# Patient Record
Sex: Male | Born: 1960 | Race: Black or African American | Hispanic: No | Marital: Married | Smoking: Never smoker
Health system: Southern US, Community
[De-identification: ages and names within clinical notes are randomized; demographics above are authoritative.]

## PROBLEM LIST (undated history)

## (undated) DIAGNOSIS — E785 Hyperlipidemia, unspecified: Secondary | ICD-10-CM

## (undated) DIAGNOSIS — N400 Enlarged prostate without lower urinary tract symptoms: Secondary | ICD-10-CM

## (undated) DIAGNOSIS — K219 Gastro-esophageal reflux disease without esophagitis: Secondary | ICD-10-CM

## (undated) DIAGNOSIS — I2510 Atherosclerotic heart disease of native coronary artery without angina pectoris: Secondary | ICD-10-CM

## (undated) DIAGNOSIS — I10 Essential (primary) hypertension: Secondary | ICD-10-CM

## (undated) HISTORY — PX: WISDOM TOOTH EXTRACTION: SHX21

## (undated) HISTORY — PX: COLONOSCOPY: SHX174

## (undated) HISTORY — DX: Benign prostatic hyperplasia without lower urinary tract symptoms: N40.0

## (undated) HISTORY — PX: HERNIA REPAIR: SHX51

---

## 2003-09-05 ENCOUNTER — Ambulatory Visit (HOSPITAL_BASED_OUTPATIENT_CLINIC_OR_DEPARTMENT_OTHER): Admission: RE | Admit: 2003-09-05 | Discharge: 2003-09-05 | Payer: Self-pay | Admitting: Surgery

## 2003-09-05 ENCOUNTER — Ambulatory Visit (HOSPITAL_COMMUNITY): Admission: RE | Admit: 2003-09-05 | Discharge: 2003-09-05 | Payer: Self-pay | Admitting: Surgery

## 2012-07-01 ENCOUNTER — Ambulatory Visit: Payer: Self-pay | Admitting: Family Medicine

## 2013-03-27 ENCOUNTER — Emergency Department: Payer: Self-pay | Admitting: Emergency Medicine

## 2013-03-27 LAB — CBC WITH DIFFERENTIAL/PLATELET
Basophil #: 0 10*3/uL (ref 0.0–0.1)
Basophil %: 0.5 %
Eosinophil #: 0.1 10*3/uL (ref 0.0–0.7)
Eosinophil %: 0.9 %
HCT: 47 % (ref 40.0–52.0)
HGB: 16.1 g/dL (ref 13.0–18.0)
Lymphocyte #: 1 10*3/uL (ref 1.0–3.6)
Lymphocyte %: 14 %
MCH: 28.2 pg (ref 26.0–34.0)
MCHC: 34.3 g/dL (ref 32.0–36.0)
MCV: 82 fL (ref 80–100)
Monocyte #: 0.4 x10 3/mm (ref 0.2–1.0)
Monocyte %: 4.7 %
Neutrophil #: 5.9 10*3/uL (ref 1.4–6.5)
Neutrophil %: 79.9 %
Platelet: 175 10*3/uL (ref 150–440)
RBC: 5.71 10*6/uL (ref 4.40–5.90)
RDW: 13.2 % (ref 11.5–14.5)
WBC: 7.4 10*3/uL (ref 3.8–10.6)

## 2013-03-27 LAB — COMPREHENSIVE METABOLIC PANEL
Albumin: 3.8 g/dL (ref 3.4–5.0)
Alkaline Phosphatase: 74 U/L
Anion Gap: 4 — ABNORMAL LOW (ref 7–16)
BUN: 13 mg/dL (ref 7–18)
Bilirubin,Total: 0.5 mg/dL (ref 0.2–1.0)
Calcium, Total: 9.1 mg/dL (ref 8.5–10.1)
Chloride: 105 mmol/L (ref 98–107)
Co2: 29 mmol/L (ref 21–32)
Creatinine: 1.24 mg/dL (ref 0.60–1.30)
EGFR (African American): >60
EGFR (Non-African Amer.): >60
Glucose: 125 mg/dL — ABNORMAL HIGH (ref 65–99)
Osmolality: 277 (ref 275–301)
Potassium: 3.9 mmol/L (ref 3.5–5.1)
SGOT(AST): 34 U/L (ref 15–37)
SGPT (ALT): 38 U/L (ref 12–78)
Sodium: 138 mmol/L (ref 136–145)
Total Protein: 7.7 g/dL (ref 6.4–8.2)

## 2013-03-27 LAB — URINALYSIS, COMPLETE
Bacteria: NONE SEEN
Bilirubin,UR: NEGATIVE
Blood: NEGATIVE
Glucose,UR: NEGATIVE mg/dL (ref 0–75)
Ketone: NEGATIVE
Leukocyte Esterase: NEGATIVE
Nitrite: NEGATIVE
Ph: 6 (ref 4.5–8.0)
Protein: NEGATIVE
RBC,UR: 1 /HPF (ref 0–5)
Specific Gravity: 1.02 (ref 1.003–1.030)
Squamous Epithelial: NONE SEEN
WBC UR: NONE SEEN /HPF (ref 0–5)

## 2013-03-27 LAB — LIPASE, BLOOD: Lipase: 510 U/L — ABNORMAL HIGH (ref 73–393)

## 2016-03-20 DIAGNOSIS — M7582 Other shoulder lesions, left shoulder: Secondary | ICD-10-CM | POA: Diagnosis not present

## 2016-05-04 DIAGNOSIS — E782 Mixed hyperlipidemia: Secondary | ICD-10-CM | POA: Diagnosis not present

## 2016-05-04 DIAGNOSIS — G44209 Tension-type headache, unspecified, not intractable: Secondary | ICD-10-CM | POA: Diagnosis not present

## 2016-05-04 DIAGNOSIS — Z23 Encounter for immunization: Secondary | ICD-10-CM | POA: Diagnosis not present

## 2016-05-04 DIAGNOSIS — N400 Enlarged prostate without lower urinary tract symptoms: Secondary | ICD-10-CM | POA: Diagnosis not present

## 2016-05-04 DIAGNOSIS — Z0000 Encounter for general adult medical examination without abnormal findings: Secondary | ICD-10-CM | POA: Diagnosis not present

## 2016-05-04 DIAGNOSIS — I10 Essential (primary) hypertension: Secondary | ICD-10-CM | POA: Diagnosis not present

## 2016-05-04 DIAGNOSIS — Z1159 Encounter for screening for other viral diseases: Secondary | ICD-10-CM | POA: Diagnosis not present

## 2016-11-03 DIAGNOSIS — N400 Enlarged prostate without lower urinary tract symptoms: Secondary | ICD-10-CM | POA: Diagnosis not present

## 2016-11-03 DIAGNOSIS — I10 Essential (primary) hypertension: Secondary | ICD-10-CM | POA: Diagnosis not present

## 2016-11-03 DIAGNOSIS — G44209 Tension-type headache, unspecified, not intractable: Secondary | ICD-10-CM | POA: Diagnosis not present

## 2016-11-03 DIAGNOSIS — E782 Mixed hyperlipidemia: Secondary | ICD-10-CM | POA: Diagnosis not present

## 2017-09-13 DIAGNOSIS — E782 Mixed hyperlipidemia: Secondary | ICD-10-CM | POA: Diagnosis not present

## 2017-09-13 DIAGNOSIS — N400 Enlarged prostate without lower urinary tract symptoms: Secondary | ICD-10-CM | POA: Diagnosis not present

## 2017-09-13 DIAGNOSIS — I10 Essential (primary) hypertension: Secondary | ICD-10-CM | POA: Diagnosis not present

## 2017-09-13 DIAGNOSIS — G44209 Tension-type headache, unspecified, not intractable: Secondary | ICD-10-CM | POA: Diagnosis not present

## 2017-09-13 DIAGNOSIS — Z0000 Encounter for general adult medical examination without abnormal findings: Secondary | ICD-10-CM | POA: Diagnosis not present

## 2017-12-08 DIAGNOSIS — R1030 Lower abdominal pain, unspecified: Secondary | ICD-10-CM | POA: Diagnosis not present

## 2017-12-08 DIAGNOSIS — R5383 Other fatigue: Secondary | ICD-10-CM | POA: Diagnosis not present

## 2018-01-06 DIAGNOSIS — R102 Pelvic and perineal pain: Secondary | ICD-10-CM | POA: Diagnosis not present

## 2018-09-20 DIAGNOSIS — E782 Mixed hyperlipidemia: Secondary | ICD-10-CM | POA: Diagnosis not present

## 2018-09-20 DIAGNOSIS — I10 Essential (primary) hypertension: Secondary | ICD-10-CM | POA: Diagnosis not present

## 2018-09-20 DIAGNOSIS — N400 Enlarged prostate without lower urinary tract symptoms: Secondary | ICD-10-CM | POA: Diagnosis not present

## 2018-09-20 DIAGNOSIS — Z0000 Encounter for general adult medical examination without abnormal findings: Secondary | ICD-10-CM | POA: Diagnosis not present

## 2018-09-20 DIAGNOSIS — G44209 Tension-type headache, unspecified, not intractable: Secondary | ICD-10-CM | POA: Diagnosis not present

## 2018-10-04 DIAGNOSIS — E782 Mixed hyperlipidemia: Secondary | ICD-10-CM | POA: Diagnosis not present

## 2018-10-04 DIAGNOSIS — Z125 Encounter for screening for malignant neoplasm of prostate: Secondary | ICD-10-CM | POA: Diagnosis not present

## 2018-10-04 DIAGNOSIS — I10 Essential (primary) hypertension: Secondary | ICD-10-CM | POA: Diagnosis not present

## 2019-04-13 ENCOUNTER — Other Ambulatory Visit: Payer: Self-pay

## 2019-04-13 ENCOUNTER — Encounter: Payer: Self-pay | Admitting: Emergency Medicine

## 2019-04-13 ENCOUNTER — Ambulatory Visit
Admission: EM | Admit: 2019-04-13 | Discharge: 2019-04-13 | Disposition: A | Discharge status: Home / Self Care | Payer: Federal, State, Local not specified - PPO | Attending: Urgent Care | Admitting: Urgent Care

## 2019-04-13 DIAGNOSIS — R519 Headache, unspecified: Secondary | ICD-10-CM | POA: Insufficient documentation

## 2019-04-13 DIAGNOSIS — E785 Hyperlipidemia, unspecified: Secondary | ICD-10-CM | POA: Diagnosis not present

## 2019-04-13 DIAGNOSIS — Z79899 Other long term (current) drug therapy: Secondary | ICD-10-CM | POA: Diagnosis not present

## 2019-04-13 DIAGNOSIS — R5383 Other fatigue: Secondary | ICD-10-CM | POA: Diagnosis not present

## 2019-04-13 DIAGNOSIS — I10 Essential (primary) hypertension: Secondary | ICD-10-CM | POA: Diagnosis not present

## 2019-04-13 DIAGNOSIS — Z20822 Contact with and (suspected) exposure to covid-19: Secondary | ICD-10-CM | POA: Insufficient documentation

## 2019-04-13 HISTORY — DX: Essential (primary) hypertension: I10

## 2019-04-13 HISTORY — DX: Hyperlipidemia, unspecified: E78.5

## 2019-04-13 NOTE — ED Triage Notes (Signed)
Patient had a +covid exposure on 04/10/19. Patient starting having headache and fatigue x 2 days. Patient has tried OTC Nyquil.

## 2019-04-13 NOTE — Discharge Instructions (Addendum)
You may take Tylenol 1000 mg with Ibuprofen 600 mg every 8h for headache. To help your immune system take the following while having symptoms.  Vit C 1000 mg a day Vit D 5000 IU a day Zinc 50 mg a day   If you develop SOB, go to ER.

## 2019-04-13 NOTE — ED Provider Notes (Signed)
[facility]    CSN: [id number] Arrival date & time: 04/13/19  [place]      History   Chief Complaint Chief Complaint  Patient presents with  . +covid exposure  . Headache  . Fatigue    HPI Brian Curry is a 59 y.o. male. who presents with HA and fatigue only x 2 days. Denies URI, fever, chills or other symptoms. Has not taken anything for his HA. Was exposed to someone with covid on 1/11.     Past Medical History:  Diagnosis Date  . Hyperlipidemia   . Hypertension     There are no problems to display for this patient.   Past Surgical History:  Procedure Laterality Date  . HERNIA REPAIR         Home Medications    Prior to Admission medications   Medication Sig Start Date End Date Taking? Authorizing Provider  ezetimibe-simvastatin (VYTORIN) 10-40 MG tablet Take 1 tablet by mouth daily. 03/23/19  Yes [provider]  losartan-hydrochlorothiazide (HYZAAR) 100-12.5 MG tablet Take 1 tablet by mouth daily. 03/23/19  Yes [provider]  naproxen (NAPROSYN) 500 MG tablet Take 500 mg by mouth 2 (two) times daily. prn 03/27/19  Yes [provider]  tamsulosin (FLOMAX) 0.4 MG CAPS capsule Take 0.4 mg by mouth daily. 03/23/19  Yes [provider]    Family History Family History  Problem Relation Age of Onset  . Heart attack Mother   . Hypertension Mother   . Hypertension Father   . Heart attack Father   . Stroke Father     Social History Social History   Tobacco Use  . Smoking status: Never Smoker  . Smokeless tobacco: Never Used  Substance Use Topics  . Alcohol use: Yes    Comment: occasional  . Drug use: Never     Allergies   Patient has no known allergies.   Review of Systems Review of Systems Negative for cough, ear  Pain, rhinitis, ST, diarrhea, chest pains, SOB, loss of taste or smell, abdominal pain.   Physical Exam Triage Vital Signs ED Triage Vitals  Enc Vitals Group     BP  04/13/19 1900 138/87     Pulse Rate 04/13/19 1900 71     Resp 04/13/19 1900 18     Temp 04/13/19 1900 98.1 F (36.7 C)     Temp Source 04/13/19 1900 Oral     SpO2 04/13/19 1900 99 %     Weight 04/13/19 1901 190 lb (86.2 kg)     Height 04/13/19 1901 5\' 7"  (1.702 m)     Head Circumference --      Peak Flow --      Pain Score 04/13/19 1901 0     Pain Loc --      Pain Edu? --      Excl. in [place]? --    No data found.  Updated Vital Signs BP 138/87 (BP Location: Left Arm)   Pulse 71   Temp 98.1 F (36.7 C) (Oral)   Resp 18   Ht 5\' 7"  (1.702 m)   Wt 190 lb (86.2 kg)   SpO2 99%   BMI 29.76 kg/m   Visual Acuity Right Eye Distance:   Left Eye Distance:   Bilateral Distance:    Right Eye Near:   Left Eye Near:    Bilateral Near:     Physical Exam Physical Exam Vitals signs and nursing note reviewed.  Constitutional:  General: he is not in acute distress.    Appearance: Normal appearance. he is not ill-appearing, toxic-appearing or diaphoretic.  HENT: PERRLA    Head: Normocephalic.     Right Ear: Tympanic membrane, ear canal and external ear normal.     Left Ear: Tympanic membrane, ear canal and external ear normal.     Nose: Nose normal.     Mouth/Throat:     Mouth: Mucous membranes are moist.  Eyes:     General: No scleral icterus.       Right eye: No discharge.        Left eye: No discharge.     Conjunctiva/sclera: Conjunctivae normal.  Neck:     Musculoskeletal: Neck supple. No neck rigidity.  Cardiovascular:     Rate and Rhythm: Normal rate and regular rhythm.     Heart sounds: No murmur.  Pulmonary:     Effort: Pulmonary effort is normal.     Breath sounds: Normal breath sounds.   Musculoskeletal: Normal range of motion.  Lymphadenopathy:     Cervical: No cervical adenopathy.  Skin:    General: Skin is warm and dry.     Coloration: Skin is not jaundiced.     Findings: No rash.  Neurological:     Mental Status: he is alert and oriented to person,  place, and time.     Gait: Gait normal.  Psychiatric:        Mood and Affect: Mood normal.        Behavior: Behavior normal.        Thought Content: Thought content normal.        Judgment: Judgment normal.    UC Treatments / Results  Labs (all labs ordered are listed, but only abnormal results are displayed) Labs Reviewed  NOVEL CORONAVIRUS, NAA (HOSP ORDER, SEND-OUT TO REF LAB; TAT 18-24 HRS)    EKG   Radiology No results found.  Procedures Procedures (including critical care time)  Medications Ordered in UC Medications - No data to display  Initial Impression / Assessment and Plan / UC Course  I have reviewed the triage vital signs and the nursing notes. Advised to stay quarantined til his covid test is back. See instructions for support.  Final Clinical Impressions(s) / UC Diagnoses   Final diagnoses:  Close exposure to COVID-19 virus  Nonintractable episodic headache, unspecified headache type  Other fatigue     Discharge Instructions     You may take Tylenol 1000 mg with Ibuprofen 600 mg every 8h for headache. To help your immune system take the following while having symptoms.  Vit C 1000 mg a day Vit D 5000 IU a day Zinc 50 mg a day   If you develop SOB, go to ER.     ED Prescriptions    None     PDMP not reviewed this encounter.   Garey Ham, [place] 04/13/19 1940

## 2019-04-15 LAB — NOVEL CORONAVIRUS, NAA (HOSP ORDER, SEND-OUT TO REF LAB; TAT 18-24 HRS): SARS-CoV-2, NAA: NOT DETECTED

## 2019-04-17 DIAGNOSIS — K219 Gastro-esophageal reflux disease without esophagitis: Secondary | ICD-10-CM | POA: Diagnosis not present

## 2019-05-21 ENCOUNTER — Other Ambulatory Visit: Payer: Self-pay

## 2019-05-21 ENCOUNTER — Ambulatory Visit
Admission: EM | Admit: 2019-05-21 | Discharge: 2019-05-21 | Disposition: A | Discharge status: Home / Self Care | Payer: Federal, State, Local not specified - PPO | Attending: Family Medicine | Admitting: Family Medicine

## 2019-05-21 ENCOUNTER — Encounter: Payer: Self-pay | Admitting: Emergency Medicine

## 2019-05-21 DIAGNOSIS — B354 Tinea corporis: Secondary | ICD-10-CM

## 2019-05-21 MED ORDER — FLUCONAZOLE 150 MG PO TABS: 150.0000 mg | ORAL_TABLET | Freq: Once | ORAL | 0 refills | Status: AC

## 2019-05-21 NOTE — ED Provider Notes (Signed)
[facility]    CSN: [id number] Arrival date & time: 05/21/19  1313      History   Chief Complaint Chief Complaint  Patient presents with  . Rash    HPI Brian Curry is a 59 y.o. male.   59 yo male with a c/o rash on his right arm for 2-3 months. States rash is itchy and seems to improve with application of otc lotrimin and cortizone but then rash comes back. Denies any drainage, fevers, chills, pain.       Past Medical History:  Diagnosis Date  . Hyperlipidemia   . Hypertension     There are no problems to display for this patient.   Past Surgical History:  Procedure Laterality Date  . HERNIA REPAIR         Home Medications    Prior to Admission medications   Medication Sig Start Date End Date Taking? Authorizing Provider  ezetimibe-simvastatin (VYTORIN) 10-40 MG tablet Take 1 tablet by mouth daily. 03/23/19  Yes [provider]  losartan-hydrochlorothiazide (HYZAAR) 100-12.5 MG tablet Take 1 tablet by mouth daily. 03/23/19  Yes [provider]  naproxen (NAPROSYN) 500 MG tablet Take 500 mg by mouth 2 (two) times daily. prn 03/27/19  Yes [provider]  tamsulosin (FLOMAX) 0.4 MG CAPS capsule Take 0.4 mg by mouth daily. 03/23/19  Yes [provider]  fluconazole (DIFLUCAN) 150 MG tablet Take 1 tablet (150 mg total) by mouth once for 1 dose. Repeat in 1 week 05/21/19 05/21/19  Norval Gable, MD    Family History Family History  Problem Relation Age of Onset  . Heart attack Mother   . Hypertension Mother   . Hypertension Father   . Heart attack Father   . Stroke Father     Social History Social History   Tobacco Use  . Smoking status: Never Smoker  . Smokeless tobacco: Never Used  Substance Use Topics  . Alcohol use: Yes    Comment: occasional  . Drug use: Never     Allergies   Patient has no known allergies.   Review of Systems Review of Systems   Physical Exam Triage Vital  Signs ED Triage Vitals  Enc Vitals Group     BP 05/21/19 1324 128/75     Pulse Rate 05/21/19 1324 68     Resp 05/21/19 1324 18     Temp 05/21/19 1324 97.6 F (36.4 C)     Temp Source 05/21/19 1324 Oral     SpO2 05/21/19 1324 99 %     Weight 05/21/19 1324 190 lb (86.2 kg)     Height 05/21/19 1324 5\' 7"  (1.702 m)     Head Circumference --      Peak Flow --      Pain Score 05/21/19 1323 0     Pain Loc --      Pain Edu? --      Excl. in [place]? --    No data found.  Updated Vital Signs BP 128/75 (BP Location: Left Arm)   Pulse 68   Temp 97.6 F (36.4 C) (Oral)   Resp 18   Ht 5\' 7"  (1.702 m)   Wt 86.2 kg   SpO2 99%   BMI 29.76 kg/m   Visual Acuity Right Eye Distance:   Left Eye Distance:   Bilateral Distance:    Right Eye Near:   Left Eye Near:    Bilateral Near:     Physical Exam  Vitals and nursing note reviewed.  Constitutional:      General: He is not in acute distress.    Appearance: He is not toxic-appearing or diaphoretic.  Skin:    Findings: Rash (approx 2x2 cm erythematous, scaly, papular, well circumscribed rash on right forearm; no drainage) present.  Neurological:     Mental Status: He is alert.      UC Treatments / Results  Labs (all labs ordered are listed, but only abnormal results are displayed) Labs Reviewed - No data to display  EKG   Radiology No results found.  Procedures Procedures (including critical care time)  Medications Ordered in UC Medications - No data to display  Initial Impression / Assessment and Plan / UC Course  I have reviewed the triage vital signs and the nursing notes.  Pertinent labs & imaging results that were available during my care of the patient were reviewed by me and considered in my medical decision making (see chart for details).      Final Clinical Impressions(s) / UC Diagnoses   Final diagnoses:  Tinea corporis     Discharge Instructions     Continue topical lotrimin and cortizone  creams    ED Prescriptions    Medication Sig Dispense Auth. Provider   fluconazole (DIFLUCAN) 150 MG tablet Take 1 tablet (150 mg total) by mouth once for 1 dose. Repeat in 1 week 2 tablet Payton Mccallum, MD      1. diagnosis reviewed with patient 2. rx as per orders above; reviewed possible side effects, interactions, risks and benefits  3. Recommend supportive treatment as above 4. Follow-up prn if symptoms worsen or don't improve   PDMP not reviewed this encounter.   Payton Mccallum, MD 05/21/19 1357

## 2019-05-21 NOTE — ED Triage Notes (Signed)
Patient in today c/o rash on his right arm x 3 months off & on. Patient has used OTC Lotrimin, blue start ointment, cortisone and witch hazel. Patient states the rash will resolve while he uses these OTC medicines, but returns when he stops using them.

## 2019-05-21 NOTE — Discharge Instructions (Signed)
Continue topical lotrimin and cortizone creams

## 2019-10-03 DIAGNOSIS — Z125 Encounter for screening for malignant neoplasm of prostate: Secondary | ICD-10-CM | POA: Diagnosis not present

## 2019-10-03 DIAGNOSIS — K219 Gastro-esophageal reflux disease without esophagitis: Secondary | ICD-10-CM | POA: Diagnosis not present

## 2019-10-03 DIAGNOSIS — E782 Mixed hyperlipidemia: Secondary | ICD-10-CM | POA: Diagnosis not present

## 2019-10-03 DIAGNOSIS — N400 Enlarged prostate without lower urinary tract symptoms: Secondary | ICD-10-CM | POA: Diagnosis not present

## 2019-10-03 DIAGNOSIS — I10 Essential (primary) hypertension: Secondary | ICD-10-CM | POA: Diagnosis not present

## 2019-10-03 DIAGNOSIS — Z0000 Encounter for general adult medical examination without abnormal findings: Secondary | ICD-10-CM | POA: Diagnosis not present

## 2019-10-03 DIAGNOSIS — Z23 Encounter for immunization: Secondary | ICD-10-CM | POA: Diagnosis not present

## 2019-10-03 DIAGNOSIS — R7309 Other abnormal glucose: Secondary | ICD-10-CM | POA: Diagnosis not present

## 2019-12-06 DIAGNOSIS — N400 Enlarged prostate without lower urinary tract symptoms: Secondary | ICD-10-CM | POA: Diagnosis not present

## 2019-12-06 DIAGNOSIS — R109 Unspecified abdominal pain: Secondary | ICD-10-CM | POA: Diagnosis not present

## 2019-12-06 DIAGNOSIS — K219 Gastro-esophageal reflux disease without esophagitis: Secondary | ICD-10-CM | POA: Diagnosis not present

## 2019-12-06 DIAGNOSIS — M5410 Radiculopathy, site unspecified: Secondary | ICD-10-CM | POA: Diagnosis not present

## 2020-01-03 DIAGNOSIS — Z23 Encounter for immunization: Secondary | ICD-10-CM | POA: Diagnosis not present

## 2020-01-29 DIAGNOSIS — M545 Low back pain, unspecified: Secondary | ICD-10-CM | POA: Diagnosis not present

## 2020-01-29 DIAGNOSIS — M5136 Other intervertebral disc degeneration, lumbar region: Secondary | ICD-10-CM | POA: Diagnosis not present

## 2020-01-29 DIAGNOSIS — M48062 Spinal stenosis, lumbar region with neurogenic claudication: Secondary | ICD-10-CM | POA: Diagnosis not present

## 2020-01-29 DIAGNOSIS — M1612 Unilateral primary osteoarthritis, left hip: Secondary | ICD-10-CM | POA: Diagnosis not present

## 2020-01-29 DIAGNOSIS — M419 Scoliosis, unspecified: Secondary | ICD-10-CM | POA: Diagnosis not present

## 2020-02-26 DIAGNOSIS — M48062 Spinal stenosis, lumbar region with neurogenic claudication: Secondary | ICD-10-CM | POA: Diagnosis not present

## 2020-02-26 DIAGNOSIS — M419 Scoliosis, unspecified: Secondary | ICD-10-CM | POA: Diagnosis not present

## 2020-02-26 DIAGNOSIS — M542 Cervicalgia: Secondary | ICD-10-CM | POA: Diagnosis not present

## 2020-02-26 DIAGNOSIS — M545 Low back pain, unspecified: Secondary | ICD-10-CM | POA: Diagnosis not present

## 2020-06-06 ENCOUNTER — Ambulatory Visit: Payer: Federal, State, Local not specified - PPO | Admitting: Cardiology

## 2020-06-06 ENCOUNTER — Other Ambulatory Visit: Payer: Self-pay

## 2020-06-06 ENCOUNTER — Encounter: Payer: Self-pay | Admitting: Cardiology

## 2020-06-06 VITALS — BP 145/73 | HR 72 | Temp 97.8°F | Resp 17 | Ht 67.0 in | Wt 203.0 lb

## 2020-06-06 DIAGNOSIS — Z6831 Body mass index (BMI) 31.0-31.9, adult: Secondary | ICD-10-CM

## 2020-06-06 DIAGNOSIS — Z8249 Family history of ischemic heart disease and other diseases of the circulatory system: Secondary | ICD-10-CM | POA: Diagnosis not present

## 2020-06-06 DIAGNOSIS — I10 Essential (primary) hypertension: Secondary | ICD-10-CM | POA: Diagnosis not present

## 2020-06-06 DIAGNOSIS — I208 Other forms of angina pectoris: Secondary | ICD-10-CM | POA: Diagnosis not present

## 2020-06-06 DIAGNOSIS — E6609 Other obesity due to excess calories: Secondary | ICD-10-CM

## 2020-06-06 DIAGNOSIS — E782 Mixed hyperlipidemia: Secondary | ICD-10-CM

## 2020-06-06 DIAGNOSIS — R072 Precordial pain: Secondary | ICD-10-CM

## 2020-06-06 MED ORDER — METOPROLOL TARTRATE 25 MG PO TABS: 25.0000 mg | ORAL_TABLET | Freq: Two times a day (BID) | ORAL | 0 refills | Status: DC

## 2020-06-06 NOTE — Progress Notes (Signed)
Date:  06/06/2020   ID:  Brian Curry, DOB 13-Feb-1961, MRN [id number]  PCP:  Maury Dus, MD  Cardiologist:  Rex Kras, DO, [facility] (established care 06/06/2020) Former Cardiology Providers: NA  REASON FOR CONSULT: Family history of heart disease  REQUESTING PHYSICIAN:  Maury Dus, MD [place],  [place] [address]  Chief Complaint  Patient presents with  . New Patient (Initial Visit)    Ref by Maury Dus, MD  . Chest Pain    HPI  Brian Curry is a 60 y.o. African American  male who presents to the office with a chief complaint of " family history of heart disease and chest pain." Patient's past medical history and cardiovascular risk factors include: Hypertension, anxiety, BPH, obesity due to excess calories, mixed hyperlipidemia, bilateral carpal tunnel,  He is referred to the office at the request of Maury Dus, MD for evaluation of family history of heart disease.  Chest pain: Patient states that he has been having indigestion for the last 8 years however, over the last 2 years and the symptoms have gotten worse.  Patient states that since January 2021 he was started on heartburn medications which initially helped with his symptoms however they have started to resurface.  He describes a burning-like sensation in the substernal region, worse with strenuous activity and sleep walking, and improves with resting.  Patient states that the symptoms are lasting for a few minutes.  Associated symptoms also include difficulty in breathing.  Patient was seen by cardiologist approximately 8 years ago and had undergone a nuclear stress test which was reported to be normal according to the patient.  Family hx of CAD mom and dad. Dad had MI around age of 34 and Mom had MI around 35.   FUNCTIONAL STATUS: Walks 1-2x per week for about 3 miles at a time.    ALLERGIES: No Known Allergies  MEDICATION LIST PRIOR TO VISIT: Current Meds   Medication Sig  . ezetimibe-simvastatin (VYTORIN) 10-40 MG tablet Take 1 tablet by mouth daily.  . famotidine (PEPCID) 40 MG tablet Take 40 mg by mouth daily.  Marland Kitchen ibuprofen (ADVIL) 200 MG tablet Take 1 tablet by mouth daily as needed.  Marland Kitchen losartan-hydrochlorothiazide (HYZAAR) 100-12.5 MG tablet Take 1 tablet by mouth daily.  . metoprolol tartrate (LOPRESSOR) 25 MG tablet Take 1 tablet (25 mg total) by mouth 2 (two) times daily.  Marland Kitchen omeprazole (PRILOSEC) 20 MG capsule Take 1 capsule by mouth daily.  . predniSONE (STERAPRED UNI-PAK 21 TAB) 5 MG (21) TBPK tablet Take 1 tablet by mouth daily as needed.  . tamsulosin (FLOMAX) 0.4 MG CAPS capsule Take 0.4 mg by mouth daily.     PAST MEDICAL HISTORY: Past Medical History:  Diagnosis Date  . BPH (benign prostatic hyperplasia)   . Hyperlipidemia   . Hypertension     PAST SURGICAL HISTORY: Past Surgical History:  Procedure Laterality Date  . HERNIA REPAIR      FAMILY HISTORY: The patient family history includes Heart attack in his father and mother; Hypertension in his father and mother; Stroke in his father.  SOCIAL HISTORY:  The patient  reports that he has never smoked. He has never used smokeless tobacco. He reports current alcohol use. He reports that he does not use drugs.  REVIEW OF SYSTEMS: Review of Systems  Constitutional: Negative for chills and fever.  HENT: Negative for hoarse voice and nosebleeds.   Eyes: Negative for discharge, double vision  and pain.  Cardiovascular: Positive for chest pain and dyspnea on exertion. Negative for claudication, leg swelling, near-syncope, orthopnea, palpitations, paroxysmal nocturnal dyspnea and syncope.  Respiratory: Negative for hemoptysis and shortness of breath.   Musculoskeletal: Negative for muscle cramps and myalgias.  Gastrointestinal: Positive for heartburn. Negative for abdominal pain, constipation, diarrhea, hematemesis, hematochezia, melena, nausea and vomiting.  Neurological:  Negative for dizziness and light-headedness.    PHYSICAL EXAM: Vitals with BMI 06/06/2020 05/21/2019 04/13/2019  Height _0  _1  _2   Weight 203 lbs 190 lbs 190 lbs  BMI 31.79 38.10 17.51  Systolic 025 852 778  Diastolic 73 75 87  Pulse 72 68 71    CONSTITUTIONAL: Well-developed and well-nourished. No acute distress.  SKIN: Skin is warm and dry. No rash noted. No cyanosis. No pallor. No jaundice HEAD: Normocephalic and atraumatic.  EYES: No scleral icterus MOUTH/THROAT: Moist oral membranes.  NECK: No JVD present. No thyromegaly noted. No carotid bruits  LYMPHATIC: No visible cervical adenopathy.  CHEST Normal respiratory effort. No intercostal retractions  LUNGS: Clear to auscultation bilaterally.  No stridor. No wheezes. No rales.  CARDIOVASCULAR: Regular rate and rhythm, positive S1-S2, no murmurs rubs or gallops appreciated. ABDOMINAL: No apparent ascites.  EXTREMITIES: No peripheral edema  HEMATOLOGIC: No significant bruising NEUROLOGIC: Oriented to person, place, and time. Nonfocal. Normal muscle tone.  PSYCHIATRIC: Normal mood and affect. Normal behavior. Cooperative  CARDIAC DATABASE: EKG: 06/06/2020: Normal sinus rhythm, 77 bpm, normal axis, without underlying ischemia or injury pattern.  Echocardiogram: No results found for this or any previous visit from the past 1095 days.   Stress Testing: No results found for this or any previous visit from the past 1095 days.  Heart Catheterization: None  LABORATORY DATA: CBC Latest Ref Rng & Units 03/27/2013  WBC 3.8 - 10.6 x10 3/mm 3 7.4  Hemoglobin 13.0 - 18.0 g/dL 16.1  Hematocrit 40.0 - 52.0 % 47.0  Platelets 150 - 440 x10 3/mm 3 175    CMP Latest Ref Rng & Units 03/27/2013  Glucose 65 - 99 mg/dL 125(H)  BUN 7 - 18 mg/dL 13  Creatinine 0.60 - 1.30 mg/dL 1.24  Sodium 136 - 145 mmol/L 138  Potassium 3.5 - 5.1 mmol/L 3.9  Chloride 98 - 107 mmol/L 105  CO2 21 - 32 mmol/L 29  Calcium 8.5 - 10.1 mg/dL 9.1   Total Protein 6.4 - 8.2 g/dL 7.7  Total Bilirubin 0.2 - 1.0 mg/dL 0.5  Alkaline Phos Unit/L 74  AST 15 - 37 Unit/L 34  ALT 12 - 78 U/L 38    Lipid Panel  No results found for: CHOL, TRIG, HDL, CHOLHDL, VLDL, LDLCALC, LDLDIRECT, LABVLDL  No components found for: NTPROBNP No results for input(s): PROBNP in the last 8760 hours. No results for input(s): TSH in the last 8760 hours.  BMP No results for input(s): NA, K, CL, CO2, GLUCOSE, BUN, CREATININE, CALCIUM, GFRNONAA, GFRAA in the last 8760 hours.  HEMOGLOBIN A1C No results found for: HGBA1C, MPG  External Labs: Collected: 10/04/2018 Creatinine 1.23 mg/dL. eGFR: 73 mL/min per 1.73 m Hemoglobin 15.9 g/dL, hematocrit 46.5% AST 20, ALT 23, alkaline phosphatase 64 Lipid profile: Total cholesterol 134, triglycerides 50, HDL 41, LDL 84, non-HDL 94  IMPRESSION:    ICD-10-CM   1. 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 Family history of premature CAD  Z82.49 EKG 12-Lead  3. Benign hypertension  I10   4. Mixed hyperlipidemia  E78.2   5. Class 1 obesity due to excess calories without serious comorbidity with body mass index (BMI) of 31.0 to 31.9 in adult  E66.09    Z68.31   6. Precordial pain  R07.2 CT CORONARY MORPH W/CTA COR W/SCORE W/CA W/CM &/OR WO/CM    CT CORONARY FRACTIONAL FLOW RESERVE DATA PREP    CT CORONARY FRACTIONAL FLOW RESERVE FLUID ANALYSIS     RECOMMENDATIONS: Brian Curry is a 60 y.o. African American male whose past medical history and cardiac risk factors include:  Hypertension, anxiety, BPH, obesity due to excess calories, mixed hyperlipidemia, bilateral carpal tunnel.   Anginal equivalent: EKG shows normal sinus rhythm without underlying  injury pattern. Patient symptoms though concerning for GERD still persist despite being treated with medical therapy.   Given his multiple cardiovascular risk factors concern for obstructive CAD. Check BMP. Echocardiogram will be ordered to evaluate for structural heart disease and left ventricular systolic function. Coronary CTA to evaluate for obstructive CAD. Lopressor 25 mg p.o. twice daily.  Benign essential hypertension: Patient states that his home blood pressures are better controlled. Continue current antihypertensive medications. Currently managed by PCP.  Hyperlipidemia, mixed: Continue statin therapy Most recent lipid profile reviewed.  Family history of premature CAD: Continue lifestyle modifications, work-up as discussed above.  FINAL MEDICATION LIST END OF ENCOUNTER: Meds ordered this encounter  Medications  . metoprolol tartrate (LOPRESSOR) 25 MG tablet    Sig: Take 1 tablet (25 mg total) by mouth 2 (two) times daily.    Dispense:  180 tablet    Refill:  0    Medications Discontinued During This Encounter  Medication Reason  . naproxen (NAPROSYN) 500 MG tablet Error     Current Outpatient Medications:  .  ezetimibe-simvastatin (VYTORIN) 10-40 MG tablet, Take 1 tablet by mouth daily., Disp: , Rfl:  .  famotidine (PEPCID) 40 MG tablet, Take 40 mg by mouth daily., Disp: , Rfl:  .  ibuprofen (ADVIL) 200 MG tablet, Take 1 tablet by mouth daily as needed., Disp: , Rfl:  .  losartan-hydrochlorothiazide (HYZAAR) 100-12.5 MG tablet, Take 1 tablet by mouth daily., Disp: , Rfl:  .  metoprolol tartrate (LOPRESSOR) 25 MG tablet, Take 1 tablet (25 mg total) by mouth 2 (two) times daily., Disp: 180 tablet, Rfl: 0 .  omeprazole (PRILOSEC) 20 MG capsule, Take 1 capsule by mouth daily., Disp: , Rfl:  .  predniSONE (STERAPRED UNI-PAK 21 TAB) 5 MG (21) TBPK tablet, Take 1 tablet by mouth daily as needed., Disp: , Rfl:  .  tamsulosin (FLOMAX) 0.4 MG CAPS capsule, Take 0.4 mg by  mouth daily., Disp: , Rfl:   Orders Placed This Encounter  Procedures  . CT CORONARY MORPH W/CTA COR W/SCORE W/CA W/CM &/OR WO/CM  . CT CORONARY FRACTIONAL FLOW RESERVE DATA PREP  . CT CORONARY FRACTIONAL FLOW RESERVE FLUID ANALYSIS  . Basic metabolic panel  . EKG 12-Lead  . PCV ECHOCARDIOGRAM COMPLETE    There are no Patient Instructions on file for this visit.   --Continue cardiac medications as reconciled in final medication list. --No follow-ups on file. Or sooner if needed. --Continue follow-up with your primary care physician regarding the management of your other chronic comorbid conditions.  Patient's questions and concerns  were addressed to his satisfaction. He voices understanding of the instructions provided during this encounter.   This note was created using a voice recognition software as a result there may be grammatical errors inadvertently enclosed that do not reflect the nature of this encounter. Every attempt is made to correct such errors.  Rex Kras, [place], [facility]  Pager: [phone] Office: [phone]

## 2020-06-11 ENCOUNTER — Other Ambulatory Visit: Payer: Self-pay

## 2020-06-11 ENCOUNTER — Ambulatory Visit: Payer: Federal, State, Local not specified - PPO

## 2020-06-11 DIAGNOSIS — I208 Other forms of angina pectoris: Secondary | ICD-10-CM | POA: Diagnosis not present

## 2020-06-21 DIAGNOSIS — I208 Other forms of angina pectoris: Secondary | ICD-10-CM | POA: Diagnosis not present

## 2020-06-22 LAB — BASIC METABOLIC PANEL
BUN/Creatinine Ratio: 12 (ref 10–24)
BUN: 17 mg/dL (ref 8–27)
CO2: 24 mmol/L (ref 20–29)
Calcium: 9.4 mg/dL (ref 8.6–10.2)
Chloride: 96 mmol/L (ref 96–106)
Creatinine, Ser: 1.44 mg/dL — ABNORMAL HIGH (ref 0.76–1.27)
Glucose: 108 mg/dL — ABNORMAL HIGH (ref 65–99)
Potassium: 4.3 mmol/L (ref 3.5–5.2)
Sodium: 136 mmol/L (ref 134–144)
eGFR: 55 mL/min/{1.73_m2} — ABNORMAL LOW (ref >59)

## 2020-06-24 ENCOUNTER — Telehealth (HOSPITAL_COMMUNITY): Payer: Self-pay | Admitting: *Deleted

## 2020-06-24 NOTE — Progress Notes (Signed)
No answer left a vm

## 2020-06-24 NOTE — Telephone Encounter (Signed)
Attempted to call patient regarding upcoming cardiac CT appointment. °Left message on voicemail with name and callback number ° °Zakaree Mcclenahan RN Navigator Cardiac Imaging °[facility] °[phone] Office °[phone] Cell ° °

## 2020-06-25 ENCOUNTER — Ambulatory Visit (HOSPITAL_COMMUNITY)
Admission: RE | Admit: 2020-06-25 | Discharge: 2020-06-25 | Disposition: A | Discharge status: Home / Self Care | Payer: Federal, State, Local not specified - PPO | Source: Ambulatory Visit | Attending: Family Medicine | Admitting: Family Medicine

## 2020-06-25 ENCOUNTER — Other Ambulatory Visit: Payer: Self-pay

## 2020-06-25 DIAGNOSIS — R072 Precordial pain: Secondary | ICD-10-CM | POA: Insufficient documentation

## 2020-06-25 DIAGNOSIS — I208 Other forms of angina pectoris: Secondary | ICD-10-CM | POA: Diagnosis not present

## 2020-06-25 IMAGING — CT CT HEART MORP W/ CTA COR W/ SCORE W/ CA W/CM &/OR W/O CM
4 of 7 series · 8 of 20 positions shown, 9 images · IV contrast (APPLIED)
Comparison: None.
COMPARISON: None.

Addendum:
EXAM:
OVER-READ INTERPRETATION  CT CHEST

The following report is an over-read performed by radiologist Dr.
Miko Nathanson [[email or web address]] on 06/25/2020. This over-read
does not include interpretation of cardiac or coronary anatomy or
pathology. The coronary CTA interpretation by the cardiologist is
attached.
HISTORY: Chest pain/anginal equiv, ECGs and troponins normal
Cardiac/Coronary  CT
TECHNIQUE: The patient was scanned on a Siemens Force scanner.
PROTOCOL: A 120 kV prospective scan was triggered in the descending thoracic
aorta at 111 HU's. Axial non-contrast 3 mm slices were carried out
through the heart. The data set was analyzed on a dedicated work
station and scored using the Agatson method. Gantry rotation speed
was 250 msecs and collimation was .6 mm. No IV beta blockade but
mg of sl NTG was given. The 3D data set was reconstructed in 5%
intervals of the 67-82 % of the R-R cycle. Diastolic phases were
analyzed on a dedicated work station using MPR, MIP and VRT modes.
The patient received 80mL OMNIPAQUE IOHEXOL 350 MG/ML SOLN of
contrast.

[Series 6: best diast 68 % · axial · 0.39mm/px · z∈[-165,-124]mm · 2 of 306 slices shown]
[im 102/306  vessel]
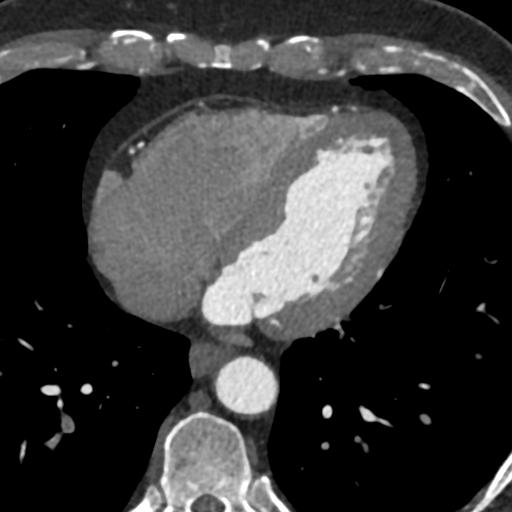
[im 204/306  vessel]
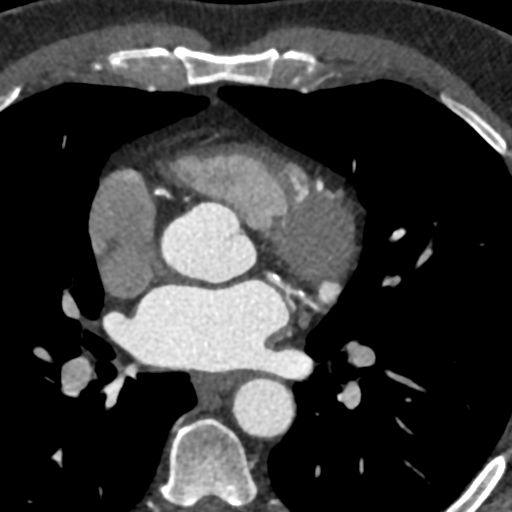

[Series 7: best syst · axial · 0.39mm/px · z∈[-165,-124]mm · 2 of 306 slices shown, 3 images]
[im 102/306  vessel]
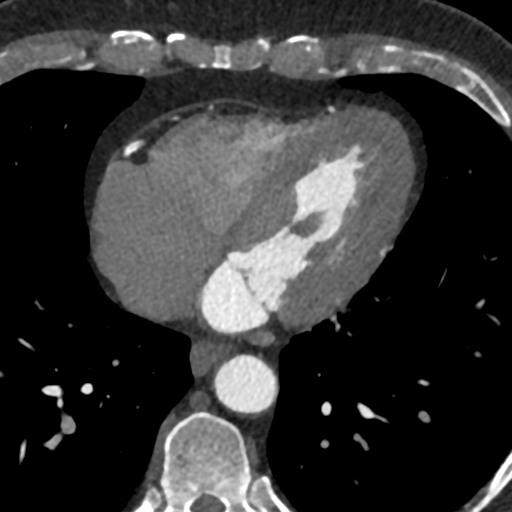
[im 102/306  lung]
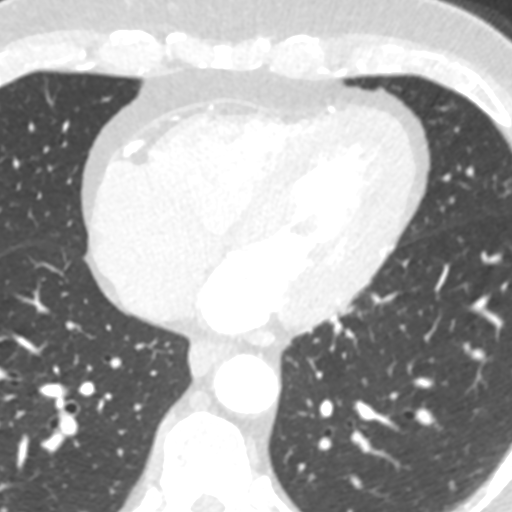
[im 204/306  vessel]
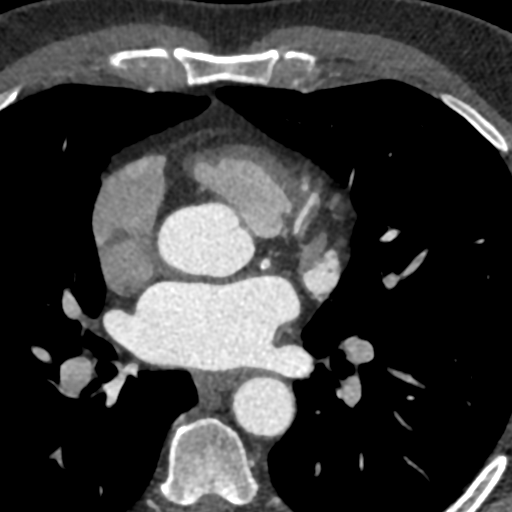

[Series 9: ts diast sharp 68 % · axial · 0.39mm/px · z∈[-165,-124]mm · 2 of 306 slices shown]
[im 102/306  lung]
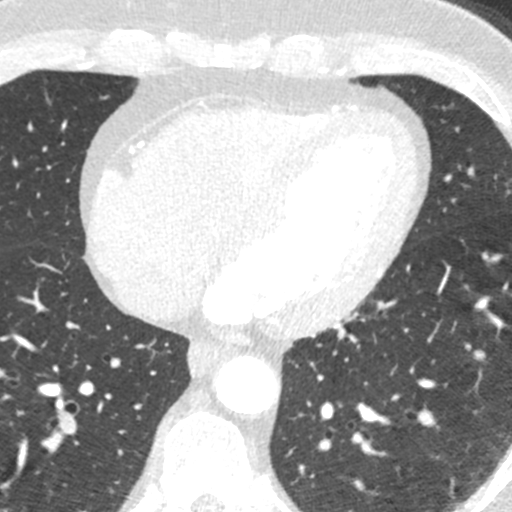
[im 204/306  lung]
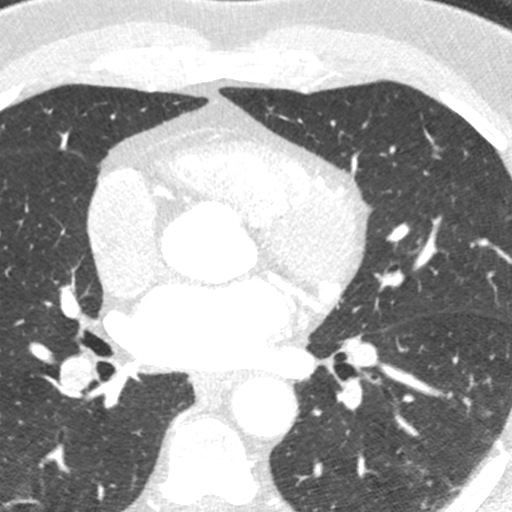

[Series 10: ts syst sharp · axial · 0.39mm/px · z∈[-165,-124]mm · 2 of 306 slices shown]
[im 102/306  lung]
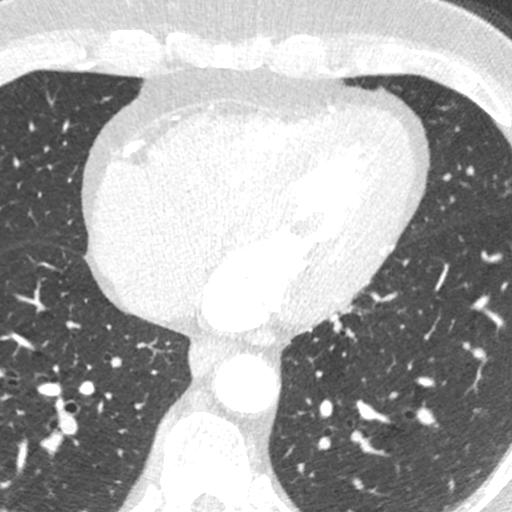
[im 204/306  lung]
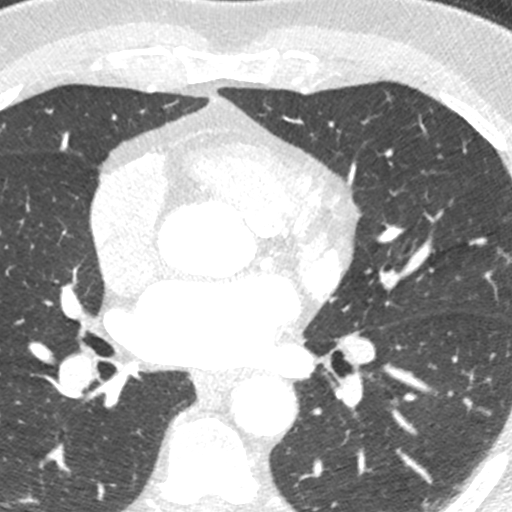

[8 of 20 positions shown; findings below may reference images not displayed]

FINDINGS: Vascular: Aortic atherosclerosis. No central pulmonary embolism, on
this non-dedicated study.

Mediastinum/Nodes: No imaged thoracic adenopathy.

Lungs/Pleura: No pleural fluid.  Anterior right lung base scarring.

Upper Abdomen: Normal imaged portions of the liver, spleen, stomach.

Musculoskeletal: No acute osseous abnormality.
IMPRESSION: 1. No acute findings in the imaged extracardiac chest.
2. Aortic Atherosclerosis (6GTEY-T8U.U).
FINDINGS: Image quality: Average.

Noise artifact is: Limited.

Coronary artery calcification score:

Left main:

Left anterior descending artery: 193

Left circumflex artery: 165

Right coronary artery: 187

Total calcium score: 584 SCHMOELLER

Total coronary calcium score of 584, places the patient at the 97th
percentile for age and sex matched control.

Coronary arteries: Normal coronary origins.  Right dominance.

Left Main Coronary Artery: The left main is a normal caliber vessel
with a normal take off from the left coronary cusp that bifurcates
to form a left anterior descending artery and a left circumflex
artery. Mild stenosis (25-49%) at the distal left main due to mixed
plaque.

Left Anterior Descending Coronary Artery: Normal caliber vessel that
wraps the apex and gives rise to two diagonal branches. Moderate
stenosis (50-69%) at the ostial/proximal LAD due to mixed plaque.
Mid to distal LAD overall patent with minimal calcified plaque. Both
diagonal branches are overall patent.

Left Circumflex Artery: LCX is non-dominant vessel travels within
the atrioventricular groove and gives rise to one large obtuse
marginal branch. Mild stenosis (25-49%) in the proximal LCx due to
calcified plaque. Mid to LCx segments are small in caliber and
travels within the AV groove. OM 1 is large caliber vessel with
superior and inferior branches. Mild stenosis (25-49%) at the ostial
OM1 due to mixed plaque, remainder of the vessel is patent with mild
diffuse calcified plaque.

Right Coronary Artery: The RCA is dominant with normal take off from
the right coronary cusp. The RCA terminates as a PDA and right
posterolateral branch. Mild stenosis (25-49%) in mid RCA due to
calcified plaque. Otherwise no significant evidence of plaque or
stenosis.

Left Atrium: Grossly normal in size with no left atrial appendage
filling defect.

Left Ventricle: Grossly normal in size. There are no stigmata of
prior infarction. There is no abnormal filling defect.

Pulmonary arteries: Normal in size without proximal filling defect.

Pulmonary veins: Normal pulmonary venous drainage.

Aorta: Normal size, 32 mm at the mid ascending aorta (level of the
PA bifurcation) measured double oblique. Aortic atherosclerosis. No
dissection.

Pericardium: Normal thickness with no significant effusion or
calcium present.

Cardiac valves: The aortic valve is trileaflet with minimal leaflet
calcification. The mitral valve is normal structure without
calcification.

Extra-cardiac findings: See attached radiology report for
non-cardiac structures.
IMPRESSION: 1. Coronary calcium score of 584. This was 97th percentile for age
and sex matched control.

2. Normal coronary origin with right dominance.

3. CAD-RADS = 3. Mild stenosis (25-49%) at the distal left main due
to mixed plaque. Moderate stenosis (50-69%) at the ostial/proximal
LAD due to mixed plaque. Mild stenosis (25-49%) in the proximal LCx
due to calcified plaque. Mild stenosis (25-49%) at the ostial OM1
due to mixed plaque. Mild stenosis (25-49%) in mid RCA due to
calcified plaque.

4. Aortic atherosclerosis.

5. Study is sent for CT-FFR to further evaluate the LM and LAD
disease. Findings will be performed and reported separately.

RECOMMENDATIONS:

Consider symptom-guided anti-ischemic pharmacotherapy as well as
risk factor modification per guideline directed care. Additional
analysis with CT FFR will be submitted.

*** End of Addendum ***
EXAM:
OVER-READ INTERPRETATION  CT CHEST

The following report is an over-read performed by radiologist Dr.
Miko Nathanson [REDACTED] on 06/25/2020. This over-read
does not include interpretation of cardiac or coronary anatomy or
pathology. The coronary CTA interpretation by the cardiologist is
attached.
FINDINGS: Vascular: Aortic atherosclerosis. No central pulmonary embolism, on
this non-dedicated study.

Mediastinum/Nodes: No imaged thoracic adenopathy.

Lungs/Pleura: No pleural fluid.  Anterior right lung base scarring.

Upper Abdomen: Normal imaged portions of the liver, spleen, stomach.

Musculoskeletal: No acute osseous abnormality.
IMPRESSION: 1. No acute findings in the imaged extracardiac chest.
2. Aortic Atherosclerosis (6GTEY-T8U.U).

## 2020-06-25 MED ORDER — NITROGLYCERIN 0.4 MG SL SUBL
SUBLINGUAL_TABLET | SUBLINGUAL | Status: AC
  Filled 2020-06-25: qty 2

## 2020-06-25 MED ORDER — IOHEXOL 350 MG/ML SOLN
80.0000 mL | Freq: Once | INTRAVENOUS | Status: AC | PRN
  Administered 2020-06-25: 80 mL via INTRAVENOUS

## 2020-06-25 MED ORDER — NITROGLYCERIN 0.4 MG SL SUBL
0.8000 mg | SUBLINGUAL_TABLET | Freq: Once | SUBLINGUAL | Status: AC | PRN
  Administered 2020-06-25: 0.8 mg via SUBLINGUAL

## 2020-06-25 NOTE — Progress Notes (Signed)
Patient called back, I have discussed results with him. I have advised patient to STOP ibuprofen and increase fluid intake from 2 to 4 glasses of water per day, and after CTA, drink plenty of fluids for 3 days to make sure his urine is clear in color. He has already completed the CTA this morning.

## 2020-06-25 NOTE — Progress Notes (Signed)
CT cardiac imaging completed. Provided with snack, rechecked BP. PIV removed and catheter tip intact. Discharged to lobby in stable condition. Ambulatory independently with no assistance.  Met with wife in waiting room.

## 2020-06-26 ENCOUNTER — Ambulatory Visit (HOSPITAL_COMMUNITY)
Admission: RE | Admit: 2020-06-26 | Discharge: 2020-06-26 | Disposition: A | Discharge status: Home / Self Care | Payer: Federal, State, Local not specified - PPO | Source: Ambulatory Visit | Attending: Cardiology | Admitting: Cardiology

## 2020-06-26 DIAGNOSIS — I208 Other forms of angina pectoris: Secondary | ICD-10-CM | POA: Insufficient documentation

## 2020-06-26 DIAGNOSIS — R072 Precordial pain: Secondary | ICD-10-CM | POA: Insufficient documentation

## 2020-06-27 ENCOUNTER — Other Ambulatory Visit: Payer: Self-pay

## 2020-06-27 ENCOUNTER — Ambulatory Visit: Payer: Federal, State, Local not specified - PPO | Admitting: Cardiology

## 2020-06-27 ENCOUNTER — Encounter: Payer: Self-pay | Admitting: Cardiology

## 2020-06-27 VITALS — BP 140/81 | HR 80 | Temp 98.1°F | Resp 16 | Ht 67.0 in | Wt 200.2 lb

## 2020-06-27 DIAGNOSIS — E782 Mixed hyperlipidemia: Secondary | ICD-10-CM

## 2020-06-27 DIAGNOSIS — E6609 Other obesity due to excess calories: Secondary | ICD-10-CM

## 2020-06-27 DIAGNOSIS — I208 Other forms of angina pectoris: Secondary | ICD-10-CM | POA: Diagnosis not present

## 2020-06-27 DIAGNOSIS — I10 Essential (primary) hypertension: Secondary | ICD-10-CM | POA: Diagnosis not present

## 2020-06-27 DIAGNOSIS — Z8249 Family history of ischemic heart disease and other diseases of the circulatory system: Secondary | ICD-10-CM

## 2020-06-27 DIAGNOSIS — Z6831 Body mass index (BMI) 31.0-31.9, adult: Secondary | ICD-10-CM

## 2020-06-27 MED ORDER — METOPROLOL TARTRATE 25 MG PO TABS: 25.0000 mg | ORAL_TABLET | Freq: Three times a day (TID) | ORAL | 0 refills | Status: DC

## 2020-06-27 MED ORDER — ASPIRIN EC 81 MG PO TBEC: 81.0000 mg | DELAYED_RELEASE_TABLET | Freq: Every day | ORAL | 3 refills | Status: DC

## 2020-06-27 MED ORDER — NITROGLYCERIN 0.4 MG SL SUBL: 0.4000 mg | SUBLINGUAL_TABLET | SUBLINGUAL | 0 refills | Status: DC | PRN

## 2020-06-27 MED ORDER — ATORVASTATIN CALCIUM 80 MG PO TABS: 80.0000 mg | ORAL_TABLET | Freq: Every evening | ORAL | 0 refills | Status: DC

## 2020-06-27 NOTE — Progress Notes (Signed)
Date:  06/27/2020   ID:  Brian Curry, DOB 14-Dec-1960, MRN [id number]  PCP:  Maury Dus, MD  Cardiologist:  Rex Kras, DO, [facility] (established care 06/06/2020) Former Cardiology Providers: NA  Date: 06/27/20 Last Office Visit: 06/06/2020  Chief Complaint  Patient presents with  . Follow-up    Reevaluate symptoms and discuss test results     HPI  Brian Curry is a 60 y.o. African American  male who presents to the office with a chief complaint of " reevaluate chest pain and discuss test results." Patient's past medical history and cardiovascular risk factors include: Hypertension, anxiety, BPH, obesity due to excess calories, mixed hyperlipidemia, bilateral carpal tunnel,  He is referred to the office at the request of Maury Dus, MD for evaluation of family history of heart disease.  Patient's wife [name] is present via speaker phone for today's encounter (cell [phone]).   Patient establish care with myself earlier this month in March 2022 given his family history of CAD in his chest discomfort which he describes as a burning-like sensation.  At the last office visit patient states that he has been having symptoms of indigestion over the last 8 years however over the last 2 years her symptoms have been getting progressively worse.  He was started on heartburn medications initially which helped his symptoms but then the symptoms resurface.  Patient symptoms were suggestive of angina pectoris and therefore the shared decision was to proceed with coronary CTA to further evaluate for obstructive CAD.  Patient is brought in sooner than his scheduled visit to discuss his symptoms and to review the coronary CTA results.  Patient states that he is not having any active chest discomfort and no discomfort over the last 48 hours.  Patient had a coronary CTA earlier this week which notes severe coronary artery calcification with a calcium score of 584.  He is noted to  have disease involving the distal left main in the ostial LAD and ostial/proximal LCx distribution.  Therefore the study was sent to CT FFR for further evaluation.  The CT FFR results came back today which notes hemodynamically significant stenosis at the level of the distal left main, ostial/proximal LAD and LCx distribution.  Family hx of CAD mom and dad. Dad had MI around age of 70 and Mom had MI around 38.   FUNCTIONAL STATUS: Walks 1-2x per week for about 3 miles at a time.    ALLERGIES: No Known Allergies  MEDICATION LIST PRIOR TO VISIT: Current Meds  Medication Sig  . aspirin EC 81 MG tablet Take 1 tablet (81 mg total) by mouth daily. Swallow whole.  Marland Kitchen atorvastatin (LIPITOR) 80 MG tablet Take 1 tablet (80 mg total) by mouth at bedtime.  . famotidine (PEPCID) 40 MG tablet Take 40 mg by mouth daily.  Marland Kitchen ibuprofen (ADVIL) 200 MG tablet Take 1 tablet by mouth daily as needed.  Marland Kitchen losartan-hydrochlorothiazide (HYZAAR) 100-12.5 MG tablet Take 1 tablet by mouth daily.  . metoprolol tartrate (LOPRESSOR) 25 MG tablet Take 1 tablet (25 mg total) by mouth in the morning, at noon, and at bedtime.  . nitroGLYCERIN (NITROSTAT) 0.4 MG SL tablet Place 1 tablet (0.4 mg total) under the tongue every 5 (five) minutes as needed for chest pain. If you require more than two tablets five minutes apart go to the nearest ER via EMS.  Marland Kitchen omeprazole (PRILOSEC) 20 MG capsule Take 1 capsule by mouth daily.  . predniSONE (STERAPRED UNI-PAK 21 TAB)  5 MG (21) TBPK tablet Take 1 tablet by mouth daily as needed.  . tamsulosin (FLOMAX) 0.4 MG CAPS capsule Take 0.4 mg by mouth daily.  . [DISCONTINUED] ezetimibe-simvastatin (VYTORIN) 10-40 MG tablet Take 1 tablet by mouth daily.  . [DISCONTINUED] metoprolol tartrate (LOPRESSOR) 25 MG tablet Take 1 tablet (25 mg total) by mouth 2 (two) times daily.     PAST MEDICAL HISTORY: Past Medical History:  Diagnosis Date  . BPH (benign prostatic hyperplasia)   . Hyperlipidemia    . Hypertension     PAST SURGICAL HISTORY: Past Surgical History:  Procedure Laterality Date  . HERNIA REPAIR      FAMILY HISTORY: The patient family history includes Heart attack in his father and mother; Hypertension in his father and mother; Stroke in his father.  SOCIAL HISTORY:  The patient  reports that he has never smoked. He has never used smokeless tobacco. He reports current alcohol use. He reports that he does not use drugs.  REVIEW OF SYSTEMS: Review of Systems  Constitutional: Negative for chills and fever.  HENT: Negative for hoarse voice and nosebleeds.   Eyes: Negative for discharge, double vision and pain.  Cardiovascular: Positive for dyspnea on exertion. Negative for chest pain, claudication, leg swelling, near-syncope, orthopnea, palpitations, paroxysmal nocturnal dyspnea and syncope.  Respiratory: Negative for hemoptysis and shortness of breath.   Musculoskeletal: Negative for muscle cramps and myalgias.  Gastrointestinal: Positive for heartburn. Negative for abdominal pain, constipation, diarrhea, hematemesis, hematochezia, melena, nausea and vomiting.  Neurological: Negative for dizziness and light-headedness.    PHYSICAL EXAM: Vitals with BMI 06/27/2020 06/25/2020 06/25/2020  Height _0  - -  Weight 200 lbs 3 oz - -  BMI 01.31 - -  Systolic 438 887 579  Diastolic 81 74 73  Pulse 80 56 54    CONSTITUTIONAL: Well-developed and well-nourished. No acute distress.  SKIN: Skin is warm and dry. No rash noted. No cyanosis. No pallor. No jaundice HEAD: Normocephalic and atraumatic.  EYES: No scleral icterus MOUTH/THROAT: Moist oral membranes.  NECK: No JVD present. No thyromegaly noted. No carotid bruits  LYMPHATIC: No visible cervical adenopathy.  CHEST Normal respiratory effort. No intercostal retractions  LUNGS: Clear to auscultation bilaterally.  No stridor. No wheezes. No rales.  CARDIOVASCULAR: Regular rate and rhythm, positive S1-S2, no murmurs rubs  or gallops appreciated. ABDOMINAL: No apparent ascites.  EXTREMITIES: No peripheral edema  HEMATOLOGIC: No significant bruising NEUROLOGIC: Oriented to person, place, and time. Nonfocal. Normal muscle tone.  PSYCHIATRIC: Normal mood and affect. Normal behavior. Cooperative  CARDIAC DATABASE: EKG: 06/06/2020: Normal sinus rhythm, 77 bpm, normal axis, without underlying ischemia or injury pattern. 06/27/2020: Normal sinus rhythm, without underlying ischemia or injury pattern  Echocardiogram: No results found for this or any previous visit from the past 1095 days.   Stress Testing: No results found for this or any previous visit from the past 1095 days.  Coronary CTA: 06/25/2020 1. Coronary calcium score of 584. This was 97th percentile for age and sex matched control.  2. Normal coronary origin with right dominance.  3. CAD-RADS = 3. Mild stenosis (25-49%) at the distal left main due to mixed plaque. Moderate stenosis (50-69%) at the ostial/proximal LAD due to mixed plaque. Mild stenosis (25-49%) in the proximal LCx due to calcified plaque. Mild stenosis (25-49%) at the ostial OM1 due to mixed plaque. Mild stenosis (25-49%) in mid RCA due to calcified plaque.  4. Aortic atherosclerosis.  5. Study is sent for CT-FFR to further evaluate  the LM and LAD disease. Findings will be performed and reported separately.  Heart Catheterization: None  LABORATORY DATA: CBC Latest Ref Rng & Units 03/27/2013  WBC 3.8 - 10.6 x10 3/mm 3 7.4  Hemoglobin 13.0 - 18.0 g/dL 16.1  Hematocrit 40.0 - 52.0 % 47.0  Platelets 150 - 440 x10 3/mm 3 175    CMP Latest Ref Rng & Units 06/21/2020 03/27/2013  Glucose 65 - 99 mg/dL 108(H) 125(H)  BUN 8 - 27 mg/dL 17 13  Creatinine 0.76 - 1.27 mg/dL 1.44(H) 1.24  Sodium 134 - 144 mmol/L 136 138  Potassium 3.5 - 5.2 mmol/L 4.3 3.9  Chloride 96 - 106 mmol/L 96 105  CO2 20 - 29 mmol/L 24 29  Calcium 8.6 - 10.2 mg/dL 9.4 9.1  Total Protein 6.4 - 8.2 g/dL - 7.7   Total Bilirubin 0.2 - 1.0 mg/dL - 0.5  Alkaline Phos Unit/L - 74  AST 15 - 37 Unit/L - 34  ALT 12 - 78 U/L - 38    Lipid Panel  No results found for: CHOL, TRIG, HDL, CHOLHDL, VLDL, LDLCALC, LDLDIRECT, LABVLDL  No components found for: NTPROBNP No results for input(s): PROBNP in the last 8760 hours. No results for input(s): TSH in the last 8760 hours.  BMP Recent Labs    06/21/20 1050  NA 136  K 4.3  CL 96  CO2 24  GLUCOSE 108*  BUN 17  CREATININE 1.44*  CALCIUM 9.4    HEMOGLOBIN A1C No results found for: HGBA1C, MPG  External Labs: Collected: 10/04/2018 Creatinine 1.23 mg/dL. eGFR: 73 mL/min per 1.73 m Hemoglobin 15.9 g/dL, hematocrit 46.5% AST 20, ALT 23, alkaline phosphatase 64 Lipid profile: Total cholesterol 134, triglycerides 50, HDL 41, LDL 84, non-HDL 94  IMPRESSION:    ICD-10-CM   1. Anginal equivalent (HCC)  I20.8 aspirin EC 81 MG tablet    nitroGLYCERIN (NITROSTAT) 0.4 MG SL tablet    atorvastatin (LIPITOR) 80 MG tablet    Basic metabolic panel    Magnesium    Lipid Panel With LDL/HDL Ratio    LDL cholesterol, direct    SARS-COV-2 RNA,(COVID-19) QUAL NAAT    metoprolol tartrate (LOPRESSOR) 25 MG tablet    DISCONTINUED: metoprolol tartrate (LOPRESSOR) 25 MG tablet  2. Family history of premature CAD  Z82.49   3. Benign hypertension  I10 EKG 12-Lead  4. Mixed hyperlipidemia  E78.2   5. Class 1 obesity due to excess calories without serious comorbidity with body mass index (BMI) of 31.0 to 31.9 in adult  E66.09    Z68.31      RECOMMENDATIONS: LAVONNE CASS is a 60 y.o. African American male whose past medical history and cardiac risk factors include:  Hypertension, anxiety, BPH, obesity due to excess calories, mixed hyperlipidemia, bilateral carpal tunnel.   Anginal equivalent: No active chest discomfort.  No chest discomfort in the last 24 to 48 hours.  As per the patient and his wife. Coronary CTA results including the images reviewed  with the patient at today's office visit. Coronary CT FFR results that were available earlier this morning notes hemodynamically significant stenosis affecting the distal left main and the ostial/proximal LAD and LCx distribution. Patient was brought into the office on a more urgent basis to reevaluate his symptoms and to discuss disease management. Patient states that he is not having any active chest pain and would like to be scheduled for elective left heart catheterization with possible intervention as opposed to going to the  ER for more urgent assessment. Patient and his wife are educated that if his symptoms resurface they should without hesitation go to the closest ER via EMS for further evaluation and management.  They both verbalized understanding. Coronary CT FFR results forthcoming. Start aspirin 81 mg p.o. daily Uptitrate Lopressor to 25 mg p.o. 3 times daily Sublingual nitroglycerin tablets to use on as needed basis.  Medication profile discussed Discontinue Zetia/simvastatin transition to Lipitor 80 mg p.o. nightly The procedure of left heart catheterization with possible intervention was explained to the patient and his wife in detail. The indication, alternatives, risks and benefits were reviewed.  Complications include but not limited to bleeding, infection, vascular injury, stroke, myocardial infection, arrhythmia, kidney injury requiring hemodialysis, radiation-related injury in the case of prolonged fluoroscopy use, emergency cardiac surgery, and death. The patient understands the risks of serious complication is 1-2 in 1275 with diagnostic cardiac cath and 1-2% or less with angioplasty/stenting.  The patient and his wife voices understanding and provides verbal feedback and wishes to proceed with coronary angiography with possible PCI.  Benign essential hypertension: Patient states that his home blood pressures are better controlled.  They are elevated at today's office visit  because he was called in than his regularly scheduled office visit to discuss results of coronary CTA. We will increase Lopressor to 25 mg p.o. 3 times daily as discussed above. Continue current antihypertensive medications. Currently managed by PCP.  Hyperlipidemia, mixed: Transition him from Zetia/simvastatin to Lipitor 80 mg p.o. nightly We will order a fasting lipid profile.  Family history of premature CAD: Continue lifestyle modifications, work-up as discussed above.  FINAL MEDICATION LIST END OF ENCOUNTER: Meds ordered this encounter  Medications  . aspirin EC 81 MG tablet    Sig: Take 1 tablet (81 mg total) by mouth daily. Swallow whole.    Dispense:  90 tablet    Refill:  3  . nitroGLYCERIN (NITROSTAT) 0.4 MG SL tablet    Sig: Place 1 tablet (0.4 mg total) under the tongue every 5 (five) minutes as needed for chest pain. If you require more than two tablets five minutes apart go to the nearest ER via EMS.    Dispense:  30 tablet    Refill:  0  . atorvastatin (LIPITOR) 80 MG tablet    Sig: Take 1 tablet (80 mg total) by mouth at bedtime.    Dispense:  90 tablet    Refill:  0  . DISCONTD: metoprolol tartrate (LOPRESSOR) 25 MG tablet    Sig: Take 1 tablet (25 mg total) by mouth in the morning, at noon, and at bedtime.    Dispense:  270 tablet    Refill:  0  . metoprolol tartrate (LOPRESSOR) 25 MG tablet    Sig: Take 1 tablet (25 mg total) by mouth in the morning, at noon, and at bedtime.    Dispense:  90 tablet    Refill:  0    Medications Discontinued During This Encounter  Medication Reason  . ezetimibe-simvastatin (VYTORIN) 10-40 MG tablet Discontinued by provider  . metoprolol tartrate (LOPRESSOR) 25 MG tablet   . metoprolol tartrate (LOPRESSOR) 25 MG tablet Change in therapy     Current Outpatient Medications:  .  aspirin EC 81 MG tablet, Take 1 tablet (81 mg total) by mouth daily. Swallow whole., Disp: 90 tablet, Rfl: 3 .  atorvastatin (LIPITOR) 80 MG tablet,  Take 1 tablet (80 mg total) by mouth at bedtime., Disp: 90 tablet, Rfl: 0 .  famotidine (PEPCID) 40 MG tablet, Take 40 mg by mouth daily., Disp: , Rfl:  .  ibuprofen (ADVIL) 200 MG tablet, Take 1 tablet by mouth daily as needed., Disp: , Rfl:  .  losartan-hydrochlorothiazide (HYZAAR) 100-12.5 MG tablet, Take 1 tablet by mouth daily., Disp: , Rfl:  .  metoprolol tartrate (LOPRESSOR) 25 MG tablet, Take 1 tablet (25 mg total) by mouth in the morning, at noon, and at bedtime., Disp: 90 tablet, Rfl: 0 .  nitroGLYCERIN (NITROSTAT) 0.4 MG SL tablet, Place 1 tablet (0.4 mg total) under the tongue every 5 (five) minutes as needed for chest pain. If you require more than two tablets five minutes apart go to the nearest ER via EMS., Disp: 30 tablet, Rfl: 0 .  omeprazole (PRILOSEC) 20 MG capsule, Take 1 capsule by mouth daily., Disp: , Rfl:  .  predniSONE (STERAPRED UNI-PAK 21 TAB) 5 MG (21) TBPK tablet, Take 1 tablet by mouth daily as needed., Disp: , Rfl:  .  tamsulosin (FLOMAX) 0.4 MG CAPS capsule, Take 0.4 mg by mouth daily., Disp: , Rfl:   Orders Placed This Encounter  Procedures  . SARS-COV-2 RNA,(COVID-19) QUAL NAAT  . Basic metabolic panel  . Magnesium  . Lipid Panel With LDL/HDL Ratio  . LDL cholesterol, direct  . EKG 12-Lead    There are no Patient Instructions on file for this visit.   --Continue cardiac medications as reconciled in final medication list. --Return in about 2 weeks (around 07/11/2020) for Post heart catheterization. Or sooner if needed. --Continue follow-up with your primary care physician regarding the management of your other chronic comorbid conditions.  Patient's questions and concerns were addressed to his satisfaction. He voices understanding of the instructions provided during this encounter.   This note was created using a voice recognition software as a result there may be grammatical errors inadvertently enclosed that do not reflect the nature of this encounter.  Every attempt is made to correct such errors.  Total time spent: 62 minutes.  Discussed his symptoms, reviewed the images of the coronary CTA and CT FFR, had a discussion with the patient's wife via speaker phone, prescribing pharmacological therapy, discussing indications of seeking medical attention sooner by going to the closest ER via EMS if he were to have recurrence of his chest discomfort, if the discomfort increases in intensity, frequency, duration, if he is requiring the use of sublingual nitroglycerin tablets, obtaining informed consent for the upcoming left heart catheterization, discussing disease management, ordering lab work, and coordination of care  [place], [place], [facility]  Pager: [phone] Office: [phone]

## 2020-06-28 ENCOUNTER — Other Ambulatory Visit: Payer: Self-pay

## 2020-06-28 ENCOUNTER — Other Ambulatory Visit (HOSPITAL_COMMUNITY)
Admission: RE | Admit: 2020-06-28 | Discharge: 2020-06-28 | Disposition: A | Discharge status: Home / Self Care | Payer: Federal, State, Local not specified - PPO | Source: Ambulatory Visit | Attending: Cardiology | Admitting: Cardiology

## 2020-06-28 DIAGNOSIS — Z20822 Contact with and (suspected) exposure to covid-19: Secondary | ICD-10-CM | POA: Insufficient documentation

## 2020-06-28 DIAGNOSIS — Z01812 Encounter for preprocedural laboratory examination: Secondary | ICD-10-CM | POA: Diagnosis not present

## 2020-06-28 DIAGNOSIS — I208 Other forms of angina pectoris: Secondary | ICD-10-CM

## 2020-06-28 LAB — SARS CORONAVIRUS 2 (TAT 6-24 HRS): SARS Coronavirus 2: NEGATIVE

## 2020-06-29 LAB — BASIC METABOLIC PANEL
BUN/Creatinine Ratio: 11 (ref 10–24)
BUN: 16 mg/dL (ref 8–27)
CO2: 22 mmol/L (ref 20–29)
Calcium: 9.3 mg/dL (ref 8.6–10.2)
Chloride: 99 mmol/L (ref 96–106)
Creatinine, Ser: 1.46 mg/dL — ABNORMAL HIGH (ref 0.76–1.27)
Glucose: 115 mg/dL — ABNORMAL HIGH (ref 65–99)
Potassium: 3.9 mmol/L (ref 3.5–5.2)
Sodium: 138 mmol/L (ref 134–144)
eGFR: 54 mL/min/{1.73_m2} — ABNORMAL LOW (ref >59)

## 2020-06-29 LAB — LIPID PANEL WITH LDL/HDL RATIO
Cholesterol, Total: 143 mg/dL (ref 100–199)
HDL: 41 mg/dL (ref >39)
LDL Chol Calc (NIH): 90 mg/dL (ref 0–99)
LDL/HDL Ratio: 2.2 ratio (ref 0.0–3.6)
Triglycerides: 60 mg/dL (ref 0–149)
VLDL Cholesterol Cal: 12 mg/dL (ref 5–40)

## 2020-06-29 LAB — LDL CHOLESTEROL, DIRECT: LDL Direct: 82 mg/dL (ref 0–99)

## 2020-07-01 DIAGNOSIS — I2510 Atherosclerotic heart disease of native coronary artery without angina pectoris: Secondary | ICD-10-CM | POA: Diagnosis present

## 2020-07-02 ENCOUNTER — Encounter (HOSPITAL_COMMUNITY)
Admission: RE | Disposition: A | Discharge status: Home / Self Care | Payer: Self-pay | Source: Ambulatory Visit | Attending: Cardiology

## 2020-07-02 ENCOUNTER — Ambulatory Visit (HOSPITAL_COMMUNITY)
Admission: RE | Admit: 2020-07-02 | Discharge: 2020-07-02 | Disposition: A | Discharge status: Home / Self Care | Payer: Federal, State, Local not specified - PPO | Source: Ambulatory Visit | Attending: Cardiology | Admitting: Cardiology

## 2020-07-02 ENCOUNTER — Other Ambulatory Visit: Payer: Self-pay

## 2020-07-02 DIAGNOSIS — Z79899 Other long term (current) drug therapy: Secondary | ICD-10-CM | POA: Diagnosis not present

## 2020-07-02 DIAGNOSIS — I25118 Atherosclerotic heart disease of native coronary artery with other forms of angina pectoris: Secondary | ICD-10-CM | POA: Diagnosis not present

## 2020-07-02 DIAGNOSIS — I10 Essential (primary) hypertension: Secondary | ICD-10-CM | POA: Diagnosis not present

## 2020-07-02 DIAGNOSIS — Z6831 Body mass index (BMI) 31.0-31.9, adult: Secondary | ICD-10-CM | POA: Insufficient documentation

## 2020-07-02 DIAGNOSIS — E782 Mixed hyperlipidemia: Secondary | ICD-10-CM | POA: Insufficient documentation

## 2020-07-02 DIAGNOSIS — E669 Obesity, unspecified: Secondary | ICD-10-CM | POA: Insufficient documentation

## 2020-07-02 DIAGNOSIS — I2510 Atherosclerotic heart disease of native coronary artery without angina pectoris: Secondary | ICD-10-CM | POA: Diagnosis present

## 2020-07-02 DIAGNOSIS — Z7982 Long term (current) use of aspirin: Secondary | ICD-10-CM | POA: Diagnosis not present

## 2020-07-02 DIAGNOSIS — I25119 Atherosclerotic heart disease of native coronary artery with unspecified angina pectoris: Secondary | ICD-10-CM | POA: Diagnosis not present

## 2020-07-02 DIAGNOSIS — Z8249 Family history of ischemic heart disease and other diseases of the circulatory system: Secondary | ICD-10-CM | POA: Insufficient documentation

## 2020-07-02 DIAGNOSIS — I208 Other forms of angina pectoris: Secondary | ICD-10-CM | POA: Diagnosis present

## 2020-07-02 HISTORY — PX: LEFT HEART CATH AND CORONARY ANGIOGRAPHY: CATH118249

## 2020-07-02 LAB — CBC
HCT: 46.8 % (ref 39.0–52.0)
Hemoglobin: 16.2 g/dL (ref 13.0–17.0)
MCH: 28.3 pg (ref 26.0–34.0)
MCHC: 34.6 g/dL (ref 30.0–36.0)
MCV: 81.7 fL (ref 80.0–100.0)
Platelets: 175 10*3/uL (ref 150–400)
RBC: 5.73 MIL/uL (ref 4.22–5.81)
RDW: 12 % (ref 11.5–15.5)
WBC: 4.5 10*3/uL (ref 4.0–10.5)
nRBC: 0 % (ref 0.0–0.2)

## 2020-07-02 SURGERY — LEFT HEART CATH AND CORONARY ANGIOGRAPHY
Anesthesia: LOCAL

## 2020-07-02 MED ORDER — VERAPAMIL HCL 2.5 MG/ML IV SOLN
INTRAVENOUS | Status: AC
  Filled 2020-07-02: qty 2

## 2020-07-02 MED ORDER — SODIUM CHLORIDE 0.9% FLUSH: 3.0000 mL | Freq: Two times a day (BID) | INTRAVENOUS | Status: DC

## 2020-07-02 MED ORDER — IOHEXOL 350 MG/ML SOLN
INTRAVENOUS | Status: DC | PRN
  Administered 2020-07-02: 30 mL

## 2020-07-02 MED ORDER — SODIUM CHLORIDE 0.9 % WEIGHT BASED INFUSION
3.0000 mL/kg/h | INTRAVENOUS | Status: AC
  Administered 2020-07-02: 3 mL/kg/h via INTRAVENOUS

## 2020-07-02 MED ORDER — ACETAMINOPHEN 325 MG PO TABS: 650.0000 mg | ORAL_TABLET | ORAL | Status: DC | PRN

## 2020-07-02 MED ORDER — HYDRALAZINE HCL 20 MG/ML IJ SOLN: 10.0000 mg | INTRAMUSCULAR | Status: DC | PRN

## 2020-07-02 MED ORDER — HEPARIN (PORCINE) IN NACL 1000-0.9 UT/500ML-% IV SOLN
INTRAVENOUS | Status: DC | PRN
  Administered 2020-07-02 (×2): 500 mL

## 2020-07-02 MED ORDER — SODIUM CHLORIDE 0.9% FLUSH: 3.0000 mL | INTRAVENOUS | Status: DC | PRN

## 2020-07-02 MED ORDER — MIDAZOLAM HCL 2 MG/2ML IJ SOLN
INTRAMUSCULAR | Status: AC
  Filled 2020-07-02: qty 2

## 2020-07-02 MED ORDER — MIDAZOLAM HCL 2 MG/2ML IJ SOLN
INTRAMUSCULAR | Status: DC | PRN
  Administered 2020-07-02: 1 mg via INTRAVENOUS

## 2020-07-02 MED ORDER — LIDOCAINE HCL (PF) 1 % IJ SOLN
INTRAMUSCULAR | Status: DC | PRN
  Administered 2020-07-02: 2 mL

## 2020-07-02 MED ORDER — ASPIRIN 81 MG PO CHEW: 81.0000 mg | CHEWABLE_TABLET | ORAL | Status: DC

## 2020-07-02 MED ORDER — VERAPAMIL HCL 2.5 MG/ML IV SOLN
INTRAVENOUS | Status: DC | PRN
  Administered 2020-07-02: 10 mL via INTRA_ARTERIAL

## 2020-07-02 MED ORDER — LABETALOL HCL 5 MG/ML IV SOLN: 10.0000 mg | INTRAVENOUS | Status: DC | PRN

## 2020-07-02 MED ORDER — FENTANYL CITRATE (PF) 100 MCG/2ML IJ SOLN
INTRAMUSCULAR | Status: DC | PRN
  Administered 2020-07-02: 50 ug via INTRAVENOUS

## 2020-07-02 MED ORDER — SODIUM CHLORIDE 0.9 % IV SOLN: INTRAVENOUS | Status: DC

## 2020-07-02 MED ORDER — LIDOCAINE HCL (PF) 1 % IJ SOLN
INTRAMUSCULAR | Status: AC
  Filled 2020-07-02: qty 30

## 2020-07-02 MED ORDER — FENTANYL CITRATE (PF) 100 MCG/2ML IJ SOLN
INTRAMUSCULAR | Status: AC
  Filled 2020-07-02: qty 2

## 2020-07-02 MED ORDER — SODIUM CHLORIDE 0.9 % IV SOLN: 250.0000 mL | INTRAVENOUS | Status: DC | PRN

## 2020-07-02 MED ORDER — ONDANSETRON HCL 4 MG/2ML IJ SOLN: 4.0000 mg | Freq: Four times a day (QID) | INTRAMUSCULAR | Status: DC | PRN

## 2020-07-02 MED ORDER — HEPARIN SODIUM (PORCINE) 1000 UNIT/ML IJ SOLN
INTRAMUSCULAR | Status: DC | PRN
  Administered 2020-07-02: 4500 [IU] via INTRAVENOUS

## 2020-07-02 MED ORDER — SODIUM CHLORIDE 0.9 % WEIGHT BASED INFUSION
1.0000 mL/kg/h | INTRAVENOUS | Status: DC
  Administered 2020-07-02: 250 mL via INTRAVENOUS

## 2020-07-02 MED ORDER — HEPARIN SODIUM (PORCINE) 1000 UNIT/ML IJ SOLN
INTRAMUSCULAR | Status: AC
  Filled 2020-07-02: qty 1

## 2020-07-02 MED ORDER — HEPARIN (PORCINE) IN NACL 1000-0.9 UT/500ML-% IV SOLN
INTRAVENOUS | Status: AC
  Filled 2020-07-02: qty 1000

## 2020-07-02 SURGICAL SUPPLY — 10 items
CATH 5FR JL3.5 JR4 ANG PIG MP (CATHETERS) ×1 IMPLANT
DEVICE RAD COMP TR BAND LRG (VASCULAR PRODUCTS) ×1 IMPLANT
GLIDESHEATH SLEND A-KIT 6F 22G (SHEATH) ×1 IMPLANT
GUIDEWIRE INQWIRE 1.5J.035X260 (WIRE) IMPLANT
INQWIRE 1.5J .035X260CM (WIRE) ×2
KIT HEART LEFT (KITS) ×2 IMPLANT
PACK CARDIAC CATHETERIZATION (CUSTOM PROCEDURE TRAY) ×2 IMPLANT
SHEATH PROBE COVER 6X72 (BAG) ×1 IMPLANT
TRANSDUCER W/STOPCOCK (MISCELLANEOUS) ×2 IMPLANT
TUBING CIL FLEX 10 FLL-RA (TUBING) ×2 IMPLANT

## 2020-07-02 NOTE — Progress Notes (Signed)
Arm board applied to right arm  

## 2020-07-02 NOTE — Discharge Instructions (Signed)
Radial Site Care  This sheet gives you information about how to care for yourself after your procedure. Your health care provider may also give you more specific instructions. If you have problems or questions, contact your health care provider. What can I expect after the procedure? After the procedure, it is common to have:  Bruising and tenderness at the catheter insertion area. Follow these instructions at home: Medicines  Take over-the-counter and prescription medicines only as told by your health care provider. Insertion site care 1. Follow instructions from your health care provider about how to take care of your insertion site. Make sure you: ? Wash your hands with soap and water before you remove your bandage (dressing). If soap and water are not available, use hand sanitizer. ? May remove dressing in 24 hours. 2. Check your insertion site every day for signs of infection. Check for: ? Redness, swelling, or pain. ? Fluid or blood. ? Pus or a bad smell. ? Warmth. 3. Do no take baths, swim, or use a hot tub for 5 days. 4. You may shower 24-48 hours after the procedure. ? Remove the dressing and gently wash the site with plain soap and water. ? Pat the area dry with a clean towel. ? Do not rub the site. That could cause bleeding. 5. Do not apply powder or lotion to the site. Activity  1. For 24 hours after the procedure, or as directed by your health care provider: ? Do not flex or bend the affected arm. ? Do not push or pull heavy objects with the affected arm. ? Do not drive yourself home from the hospital or clinic. You may drive 24 hours after the procedure. ? Do not operate machinery or power tools. ? KEEP ARM ELEVATED THE REMAINDER OF THE DAY. 2. Do not push, pull or lift anything that is heavier than 10 lb for 5 days. 3. Ask your health care provider when it is okay to: ? Return to work or school. ? Resume usual physical activities or sports. ? Resume sexual  activity. General instructions  If the catheter site starts to bleed, raise your arm and put firm pressure on the site. If the bleeding does not stop, get help right away. This is a medical emergency.  DRINK PLENTY OF FLUIDS FOR THE NEXT 2-3 DAYS.  No alcohol consumption for 24 hours after receiving sedation.  If you went home on the same day as your procedure, a responsible adult should be with you for the first 24 hours after you arrive home.  Keep all follow-up visits as told by your health care provider. This is important. Contact a health care provider if:  You have a fever.  You have redness, swelling, or yellow drainage around your insertion site. Get help right away if:  You have unusual pain at the radial site.  The catheter insertion area swells very fast.  The insertion area is bleeding, and the bleeding does not stop when you hold steady pressure on the area.  Your arm or hand becomes pale, cool, tingly, or numb. These symptoms may represent a serious problem that is an emergency. Do not wait to see if the symptoms will go away. Get medical help right away. Call your local emergency services (911 in the [place] Do not drive yourself to the hospital. Summary  After the procedure, it is common to have bruising and tenderness at the site.  Follow instructions from your health care provider about how to take care   of your radial site wound. Check the wound every day for signs of infection.  This information is not intended to replace advice given to you by your health care provider. Make sure you discuss any questions you have with your health care provider. Document Revised: 04/21/2017 Document Reviewed: 04/21/2017 Elsevier Patient Education  2020 [employer] 

## 2020-07-02 NOTE — Progress Notes (Signed)
Discharge instructions reviewed with pt and his wife both voice understanding.  

## 2020-07-02 NOTE — Interval H&P Note (Signed)
History and Physical Interval Note:  07/02/2020 7:38 AM  Brian Curry  has presented today for surgery, with the diagnosis of precordial pain.  The various methods of treatment have been discussed with the patient and family. After consideration of risks, benefits and other options for treatment, the patient has consented to  Procedure(s): LEFT HEART CATH AND CORONARY ANGIOGRAPHY (N/A) as a surgical intervention.  The patient's history has been reviewed, patient examined, no change in status, stable for surgery.  I have reviewed the patient's chart and labs.  Questions were answered to the patient's satisfaction.    2016/2017 Appropriate Use Criteria for Coronary Revascularization Clinical Presentation: Diabetes Mellitus? Symptom Status? S/P CABG? Antianginal Therapy (# of long-acting drugs)? Results of Non-invasive testing? FFR/iFR results in all diseased vessels? Patient undergoing renal transplant? Patient undergoing percutaneous valve procedure (TAVR, MitraClip, Others)? Symptom Status:  Ischemic Symptoms  Non-invasive Testing:  High risk  If no or indeterminate stress test, FFR/iFR results in all diseased vessels:  N/A  Diabetes Mellitus:  No  S/P CABG:  No  Antianginal therapy (number of long-acting drugs):  1  Patient undergoing renal transplant:  No  Patient undergoing percutaneous valve procedure:  No    newline 1 Vessel Disease PCI CABG  No proximal LAD involvement, No proximal left dominant LCX involvement M (6); Indication 2 M (4); Indication 2   Proximal left dominant LCX involvement A (7); Indication 5 A (7); Indication 5   Proximal LAD involvement A (7); Indication 5 A (7); Indication 5   newline 2 Vessel Disease  No proximal LAD involvement A (7); Indication 8 M (6); Indication 8   Proximal LAD involvement A (7); Indication 11 A (7); Indication 11   newline 3 Vessel Disease  Low disease complexity (e.g., focal stenoses, SYNTAX <=22) A (7); Indication 17 A (8);  Indication 17   Intermediate or high disease complexity (e.g., SYNTAX >=23) M (6); Indication 21 A (8); Indication 21   newline Left Main Disease  Isolated LMCA disease: ostial or midshaft A (7); Indication 24 A (9); Indication 24   Isolated LMCA disease: bifurcation involvement M (5); Indication 25 A (9); Indication 25   LMCA ostial or midshaft, concurrent low disease burden multivessel disease (e.g., 1-2 additional focal stenoses, SYNTAX <=22) A (7); Indication 26 A (9); Indication 26   LMCA ostial or midshaft, concurrent intermediate or high disease burden multivessel disease (e.g., 1-2 additional bifurcation stenoses, long stenoses, SYNTAX >=23) M (4); Indication 27 A (9); Indication 27   LMCA bifurcation involvement, concurrent low disease burden multivessel disease (e.g., 1-2 additional focal stenoses, SYNTAX <=22) M (5); Indication 28 A (9); Indication 28   LMCA bifurcation involvement, concurrent intermediate or high disease burden multivessel disease (e.g., 1-2 additional bifurcation stenoses, long stenoses, SYNTAX >=23) R (3); Indication 29 A (9); Indication 29     Notes: A indicates appropriate. M indicates may be appropriate. R indicates rarely appropriate. Number in parentheses is median score for that indication. Reclassify indicates number of functionally diseased vessels should be decreased given negative FFR/iFR. Re-evaluate the scenario interpreting any FFR/iFR negative vessel as being not significantly stenosed. Disease means involved vessel provides flow to a sufficient amount of myocardium to be clinically important. If FFR testing indicates a vessel is not significant, that vessel should not be considered diseased (and the patient should be reclassified with respect to extent of functionally significant disease). Proximal LAD + proximal left dominant LCX is considered 3 vessel CAD 2 Vessel CAD  with FFR/iFR abnormal in only 1 but not both is considered 1 vessel CAD Disease  complexity includes occlusion, bifurcation, trifurcation, ostial, >20 mm, tortuosity, calcification, thrombus LMCA disease is >=50% by angiography, MLD <2.8 mm, MLA <6 mm2; MLA 6-7.5 mm2 requires further physiologic See Table B for risk stratification based on noninvasive testing   Brian Curry

## 2020-07-02 NOTE — H&P (Signed)
OV 3.31.2022 copied for documentation     Date:  07/02/2020   ID:  Brian Curry, DOB 1961-01-23, MRN [id number]  PCP:  Maury Dus, MD  Cardiologist:  Nigel Mormon, DO, [facility] (established care 06/06/2020) Former Cardiology Providers: NA  Date: 06/27/20 Last Office Visit: 06/06/2020  No chief complaint on file.   HPI  Brian Curry is a 60 y.o. African American  male who presents to the office with a chief complaint of " reevaluate chest pain and discuss test results." Patient's past medical history and cardiovascular risk factors include: Hypertension, anxiety, BPH, obesity due to excess calories, mixed hyperlipidemia, bilateral carpal tunnel,  He is referred to the office at the request of No ref. provider found for evaluation of family history of heart disease.  Patient's wife Brian Curry is present via speaker phone for today's encounter (cell [phone]).   Patient establish care with myself earlier this month in March 2022 given his family history of CAD in his chest discomfort which he describes as a burning-like sensation.  At the last office visit patient states that he has been having symptoms of indigestion over the last 8 years however over the last 2 years her symptoms have been getting progressively worse.  He was started on heartburn medications initially which helped his symptoms but then the symptoms resurface.  Patient symptoms were suggestive of angina pectoris and therefore the shared decision was to proceed with coronary CTA to further evaluate for obstructive CAD.  Patient is brought in sooner than his scheduled visit to discuss his symptoms and to review the coronary CTA results.  Patient states that he is not having any active chest discomfort and no discomfort over the last 48 hours.  Patient had a coronary CTA earlier this week which notes severe coronary artery calcification with a calcium score of 584.  He is noted to have disease involving the  distal left main in the ostial LAD and ostial/proximal LCx distribution.  Therefore the study was sent to CT FFR for further evaluation.  The CT FFR results came back today which notes hemodynamically significant stenosis at the level of the distal left main, ostial/proximal LAD and LCx distribution.  Family hx of CAD mom and dad. Dad had MI around age of 59 and Mom had MI around 87.   FUNCTIONAL STATUS: Walks 1-2x per week for about 3 miles at a time.    ALLERGIES: No Known Allergies  MEDICATION LIST PRIOR TO VISIT: Current Meds  Medication Sig  . aspirin EC 81 MG tablet Take 1 tablet (81 mg total) by mouth daily. Swallow whole.  Marland Kitchen atorvastatin (LIPITOR) 80 MG tablet Take 1 tablet (80 mg total) by mouth at bedtime. (Patient taking differently: Take 80 mg by mouth at bedtime.)  . famotidine (PEPCID) 40 MG tablet Take 40 mg by mouth daily.  Marland Kitchen ibuprofen (ADVIL) 200 MG tablet Take 400 mg by mouth daily as needed for headache.  . losartan-hydrochlorothiazide (HYZAAR) 100-12.5 MG tablet Take 1 tablet by mouth daily.  . metoprolol tartrate (LOPRESSOR) 25 MG tablet Take 1 tablet (25 mg total) by mouth in the morning, at noon, and at bedtime.  . nitroGLYCERIN (NITROSTAT) 0.4 MG SL tablet Place 1 tablet (0.4 mg total) under the tongue every 5 (five) minutes as needed for chest pain. If you require more than two tablets five minutes apart go to the nearest ER via EMS.  Marland Kitchen omeprazole (PRILOSEC) 20 MG capsule Take 20 mg by mouth daily.  Marland Kitchen  tamsulosin (FLOMAX) 0.4 MG CAPS capsule Take 0.4 mg by mouth daily.  . TURMERIC-GINGER PO Take 2 tablets by mouth See admin instructions. Five times a week     PAST MEDICAL HISTORY: Past Medical History:  Diagnosis Date  . BPH (benign prostatic hyperplasia)   . Hyperlipidemia   . Hypertension     PAST SURGICAL HISTORY: Past Surgical History:  Procedure Laterality Date  . HERNIA REPAIR      FAMILY HISTORY: The patient family history includes Heart attack  in his father and mother; Hypertension in his father and mother; Stroke in his father.  SOCIAL HISTORY:  The patient  reports that he has never smoked. He has never used smokeless tobacco. He reports current alcohol use. He reports that he does not use drugs.  REVIEW OF SYSTEMS: Review of Systems  Constitutional: Negative for chills and fever.  HENT: Negative for hoarse voice and nosebleeds.   Eyes: Negative for discharge, double vision and pain.  Cardiovascular: Positive for dyspnea on exertion. Negative for chest pain, claudication, leg swelling, near-syncope, orthopnea, palpitations, paroxysmal nocturnal dyspnea and syncope.  Respiratory: Negative for hemoptysis and shortness of breath.   Musculoskeletal: Negative for muscle cramps and myalgias.  Gastrointestinal: Positive for heartburn. Negative for abdominal pain, constipation, diarrhea, hematemesis, hematochezia, melena, nausea and vomiting.  Neurological: Negative for dizziness and light-headedness.    PHYSICAL EXAM: Vitals with BMI 07/02/2020 06/27/2020 06/25/2020  Height '5\' 7"'  '5\' 7"'  -  Weight 200 lbs 200 lbs 3 oz -  BMI 06.26 94.85 -  Systolic 462 703 500  Diastolic 80 81 74  Pulse 60 80 56    CONSTITUTIONAL: Well-developed and well-nourished. No acute distress.  SKIN: Skin is warm and dry. No rash noted. No cyanosis. No pallor. No jaundice HEAD: Normocephalic and atraumatic.  EYES: No scleral icterus MOUTH/THROAT: Moist oral membranes.  NECK: No JVD present. No thyromegaly noted. No carotid bruits  LYMPHATIC: No visible cervical adenopathy.  CHEST Normal respiratory effort. No intercostal retractions  LUNGS: Clear to auscultation bilaterally.  No stridor. No wheezes. No rales.  CARDIOVASCULAR: Regular rate and rhythm, positive S1-S2, no murmurs rubs or gallops appreciated. ABDOMINAL: No apparent ascites.  EXTREMITIES: No peripheral edema  HEMATOLOGIC: No significant bruising NEUROLOGIC: Oriented to person, place, and  time. Nonfocal. Normal muscle tone.  PSYCHIATRIC: Normal mood and affect. Normal behavior. Cooperative  CARDIAC DATABASE: EKG: 06/06/2020: Normal sinus rhythm, 77 bpm, normal axis, without underlying ischemia or injury pattern. 06/27/2020: Normal sinus rhythm, without underlying ischemia or injury pattern  Echocardiogram: No results found for this or any previous visit from the past 1095 days.   Stress Testing: No results found for this or any previous visit from the past 1095 days.  Coronary CTA: 06/25/2020 1. Coronary calcium score of 584. This was 97th percentile for age and sex matched control.  2. Normal coronary origin with right dominance.  3. CAD-RADS = 3. Mild stenosis (25-49%) at the distal left main due to mixed plaque. Moderate stenosis (50-69%) at the ostial/proximal LAD due to mixed plaque. Mild stenosis (25-49%) in the proximal LCx due to calcified plaque. Mild stenosis (25-49%) at the ostial OM1 due to mixed plaque. Mild stenosis (25-49%) in mid RCA due to calcified plaque.  4. Aortic atherosclerosis.  5. Study is sent for CT-FFR to further evaluate the LM and LAD disease. Findings will be performed and reported separately.  Heart Catheterization: None  LABORATORY DATA: CBC Latest Ref Rng & Units 07/02/2020 03/27/2013  WBC 4.0 - 10.5  K/uL 4.5 7.4  Hemoglobin 13.0 - 17.0 g/dL 16.2 16.1  Hematocrit 39.0 - 52.0 % 46.8 47.0  Platelets 150 - 400 K/uL 175 175    CMP Latest Ref Rng & Units 06/28/2020 06/21/2020 03/27/2013  Glucose 65 - 99 mg/dL 115(H) 108(H) 125(H)  BUN 8 - 27 mg/dL '16 17 13  ' Creatinine 0.76 - 1.27 mg/dL 1.46(H) 1.44(H) 1.24  Sodium 134 - 144 mmol/L 138 136 138  Potassium 3.5 - 5.2 mmol/L 3.9 4.3 3.9  Chloride 96 - 106 mmol/L 99 96 105  CO2 20 - 29 mmol/L '22 24 29  ' Calcium 8.6 - 10.2 mg/dL 9.3 9.4 9.1  Total Protein 6.4 - 8.2 g/dL - - 7.7  Total Bilirubin 0.2 - 1.0 mg/dL - - 0.5  Alkaline Phos Unit/L - - 74  AST 15 - 37 Unit/L - - 34  ALT 12 -  78 U/L - - 38    Lipid Panel     Component Value Date/Time   CHOL 143 06/28/2020 1001   TRIG 60 06/28/2020 1001   HDL 41 06/28/2020 1001   LDLCALC 90 06/28/2020 1001   LDLDIRECT 82 06/28/2020 1001   LABVLDL 12 06/28/2020 1001    No components found for: NTPROBNP No results for input(s): PROBNP in the last 8760 hours. No results for input(s): TSH in the last 8760 hours.  BMP Recent Labs    06/21/20 1050 06/28/20 1001  NA 136 138  K 4.3 3.9  CL 96 99  CO2 24 22  GLUCOSE 108* 115*  BUN 17 16  CREATININE 1.44* 1.46*  CALCIUM 9.4 9.3    HEMOGLOBIN A1C No results found for: HGBA1C, MPG  External Labs: Collected: 10/04/2018 Creatinine 1.23 mg/dL. eGFR: 73 mL/min per 1.73 m Hemoglobin 15.9 g/dL, hematocrit 46.5% AST 20, ALT 23, alkaline phosphatase 64 Lipid profile: Total cholesterol 134, triglycerides 50, HDL 41, LDL 84, non-HDL 94  IMPRESSION:  No diagnosis found.   RECOMMENDATIONS: Brian Curry is a 60 y.o. African American male whose past medical history and cardiac risk factors include:  Hypertension, anxiety, BPH, obesity due to excess calories, mixed hyperlipidemia, bilateral carpal tunnel.   Anginal equivalent: No active chest discomfort.  No chest discomfort in the last 24 to 48 hours.  As per the patient and his wife. Coronary CTA results including the images reviewed with the patient at today's office visit. Coronary CT FFR results that were available earlier this morning notes hemodynamically significant stenosis affecting the distal left main and the ostial/proximal LAD and LCx distribution. Patient was brought into the office on a more urgent basis to reevaluate his symptoms and to discuss disease management. Patient states that he is not having any active chest pain and would like to be scheduled for elective left heart catheterization with possible intervention as opposed to going to the ER for more urgent assessment. Patient and his wife are  educated that if his symptoms resurface they should without hesitation go to the closest ER via EMS for further evaluation and management.  They both verbalized understanding. Coronary CT FFR results forthcoming. Start aspirin 81 mg p.o. daily Uptitrate Lopressor to 25 mg p.o. 3 times daily Sublingual nitroglycerin tablets to use on as needed basis.  Medication profile discussed Discontinue Zetia/simvastatin transition to Lipitor 80 mg p.o. nightly The procedure of left heart catheterization with possible intervention was explained to the patient and his wife in detail. The indication, alternatives, risks and benefits were reviewed.  Complications include but not limited  to bleeding, infection, vascular injury, stroke, myocardial infection, arrhythmia, kidney injury requiring hemodialysis, radiation-related injury in the case of prolonged fluoroscopy use, emergency cardiac surgery, and death. The patient understands the risks of serious complication is 1-2 in 2703 with diagnostic cardiac cath and 1-2% or less with angioplasty/stenting.  The patient and his wife voices understanding and provides verbal feedback and wishes to proceed with coronary angiography with possible PCI.  Benign essential hypertension: Patient states that his home blood pressures are better controlled.  They are elevated at today's office visit because he was called in than his regularly scheduled office visit to discuss results of coronary CTA. We will increase Lopressor to 25 mg p.o. 3 times daily as discussed above. Continue current antihypertensive medications. Currently managed by PCP.  Hyperlipidemia, mixed: Transition him from Zetia/simvastatin to Lipitor 80 mg p.o. nightly We will order a fasting lipid profile.  Family history of premature CAD: Continue lifestyle modifications, work-up as discussed above.  FINAL MEDICATION LIST END OF ENCOUNTER: Meds ordered this encounter  Medications  . sodium chloride flush  (NS) 0.9 % injection 3 mL  . sodium chloride flush (NS) 0.9 % injection 3 mL  . 0.9 %  sodium chloride infusion  . aspirin chewable tablet 81 mg  . FOLLOWED BY Linked Order Group   . 0.9% sodium chloride infusion   . 0.9% sodium chloride infusion  . Heparin (Porcine) in NaCl 1000-0.9 UT/500ML-% SOLN    There are no discontinued medications.   Current Facility-Administered Medications:  .  0.9 %  sodium chloride infusion, 250 mL, Intravenous, PRN, Shamiyah Ngu J, MD .  [EXPIRED] 0.9% sodium chloride infusion, 3 mL/kg/hr, Intravenous, Continuous, Last Rate: 272.4 mL/hr at 07/02/20 0630, 3 mL/kg/hr at 07/02/20 0630 **FOLLOWED BY** 0.9% sodium chloride infusion, 1 mL/kg/hr, Intravenous, Continuous, Caro Brundidge J, MD, Last Rate: 90.8 mL/hr at 07/02/20 0730, 1 mL/kg/hr at 07/02/20 0730 .  [START ON 07/03/2020] aspirin chewable tablet 81 mg, 81 mg, Oral, Pre-Cath, Reyah Streeter J, MD .  Heparin (Porcine) in NaCl 1000-0.9 UT/500ML-% SOLN, , , PRN, Remedios Mckone J, MD, 500 mL at 07/02/20 0734 .  sodium chloride flush (NS) 0.9 % injection 3 mL, 3 mL, Intravenous, Q12H, Amandy Chubbuck J, MD .  sodium chloride flush (NS) 0.9 % injection 3 mL, 3 mL, Intravenous, PRN, Royce Sciara J, MD  Orders Placed This Encounter  Procedures  . CBC  . Diet NPO time specified  . Informed Consent Details: Physician/Practitioner Attestation; Transcribe to consent form and obtain patient signature  . Confirm CBC and BMP (or CMP) results within 7 days for inpatient and 30 days for outpatient: Outpatients with severe anemia (hgb<10, CKD, severe thrombocytopenia plts<100) labs should be within 10 days. Only draw PT/INR on patients that are on Coumadin, Hgb<10, have liver disease (cirrhosis, liver CA, hepatitis, etc). Urine pregnancy test within hospital admission for inpatients of child bearing age, for outpatients day of procedure.  . Confirm EKG performed within 30 days for cardiac procedures  and 12 months for peripheral vascular procedures.  Place order for EKG if missing or not within timeframe.  . Verify aspirin and / or anti-platelet medication (Plavix, Effient, Brilinta) dose available for cardiac / peripheral vascular procedure day. IF ordered daily / once, adjust schedule to administer before procedure.  . Weigh patient  . Initiate Cath/PCI clinical path; encourage patient to watch CCTV video  . Clip R groin  . Clip R radial (no IV/bracelet R wrist)  . Insert peripheral IV  .  Insert 2nd peripheral IV site-Saline lock IV    There are no outpatient Patient Instructions on file for this admission.   --Continue cardiac medications as reconciled in final medication list. --No follow-ups on file. Or sooner if needed. --Continue follow-up with your primary care physician regarding the management of your other chronic comorbid conditions.  Patient's questions and concerns were addressed to his satisfaction. He voices understanding of the instructions provided during this encounter.   This note was created using a voice recognition software as a result there may be grammatical errors inadvertently enclosed that do not reflect the nature of this encounter. Every attempt is made to correct such errors.  Total time spent: 62 minutes.  Discussed his symptoms, reviewed the images of the coronary CTA and CT FFR, had a discussion with the patient's wife via speaker phone, prescribing pharmacological therapy, discussing indications of seeking medical attention sooner by going to the closest ER via EMS if he were to have recurrence of his chest discomfort, if the discomfort increases in intensity, frequency, duration, if he is requiring the use of sublingual nitroglycerin tablets, obtaining informed consent for the upcoming left heart catheterization, discussing disease management, ordering lab work, and coordination of care  [place], [place], [facility]  Pager: [phone] Office:  [phone]

## 2020-07-03 ENCOUNTER — Telehealth: Payer: Self-pay

## 2020-07-03 ENCOUNTER — Encounter (HOSPITAL_COMMUNITY): Payer: Self-pay | Admitting: Cardiology

## 2020-07-03 NOTE — Telephone Encounter (Signed)
Spoke to the patient and his concerns addressed.  ST

## 2020-07-03 NOTE — Telephone Encounter (Signed)
Pt called and stated that he had a cath done by Dr. Rosemary Holms and was referred to Dr. Vickey Sages. He said it then got switched to Dr. Cliffton Asters. He is confused and would like some clarification about what to do and why it got switched.

## 2020-07-04 ENCOUNTER — Institutional Professional Consult (permissible substitution): Payer: Federal, State, Local not specified - PPO | Admitting: Thoracic Surgery (Cardiothoracic Vascular Surgery)

## 2020-07-04 ENCOUNTER — Encounter: Payer: Self-pay | Admitting: Thoracic Surgery (Cardiothoracic Vascular Surgery)

## 2020-07-04 ENCOUNTER — Encounter: Payer: Federal, State, Local not specified - PPO | Admitting: Cardiothoracic Surgery

## 2020-07-04 ENCOUNTER — Other Ambulatory Visit: Payer: Self-pay

## 2020-07-04 ENCOUNTER — Other Ambulatory Visit: Payer: Self-pay | Admitting: *Deleted

## 2020-07-04 VITALS — BP 125/77 | HR 59 | Temp 97.7°F | Resp 20 | Ht 67.0 in | Wt 200.0 lb

## 2020-07-04 DIAGNOSIS — I2510 Atherosclerotic heart disease of native coronary artery without angina pectoris: Secondary | ICD-10-CM

## 2020-07-04 DIAGNOSIS — I25118 Atherosclerotic heart disease of native coronary artery with other forms of angina pectoris: Secondary | ICD-10-CM

## 2020-07-04 NOTE — Progress Notes (Signed)
[address]       [place] [address]             [phone]        Fleet Contras [facility] Medical Record #[id number] Date of Birth: 08-08-1960  Referring: Elder Negus, MD Primary Care: Elias Else, MD Primary Cardiologist:No primary care provider on file.  Chief Complaint:    Chief Complaint  Patient presents with  . Coronary Artery Disease    Surgical consult, Cardiac Cath 07/02/20,  ECHO 06/11/20    History of Present Illness:     60 year old male referred by Dr. Odis Hollingshead for surgical evaluation of severe left main disease.  He has been experiencing some chest pain over the last several months and underwent an elective left heart cath which identified his disease.  Prior to this he was fairly active but has gradually slowing himself down.   Past Medical and Surgical History: Previous Chest Surgery: No Previous Chest Radiation: No Diabetes Mellitus: No.  HbA1C pending Creatinine: 1.46  Past Medical History:  Diagnosis Date  . BPH (benign prostatic hyperplasia)   . Hyperlipidemia   . Hypertension     Past Surgical History:  Procedure Laterality Date  . HERNIA REPAIR    . LEFT HEART CATH AND CORONARY ANGIOGRAPHY N/A 07/02/2020   Procedure: LEFT HEART CATH AND CORONARY ANGIOGRAPHY;  Surgeon: Elder Negus, MD;  Location: [facility];  Service: Cardiovascular;  Laterality: N/A;    Social History: Support: Presents to clinic with his wife  Social History   Tobacco Use  Smoking Status Never Smoker  Smokeless Tobacco Never Used    Social History   Substance and Sexual Activity  Alcohol Use Yes   Comment: occasional     No Known Allergies    Current Outpatient Medications  Medication Sig Dispense Refill  . aspirin EC 81 MG tablet Take 1 tablet (81 mg total) by mouth daily. Swallow whole. 90 tablet 3  . atorvastatin (LIPITOR) 80 MG tablet Take 1 tablet (80 mg total) by mouth at bedtime. (Patient taking  differently: Take 80 mg by mouth at bedtime.) 90 tablet 0  . famotidine (PEPCID) 40 MG tablet Take 40 mg by mouth daily.    Marland Kitchen losartan-hydrochlorothiazide (HYZAAR) 100-12.5 MG tablet Take 1 tablet by mouth daily.    . metoprolol tartrate (LOPRESSOR) 25 MG tablet Take 1 tablet (25 mg total) by mouth in the morning, at noon, and at bedtime. 90 tablet 0  . nitroGLYCERIN (NITROSTAT) 0.4 MG SL tablet Place 1 tablet (0.4 mg total) under the tongue every 5 (five) minutes as needed for chest pain. If you require more than two tablets five minutes apart go to the nearest ER via EMS. 30 tablet 0  . omeprazole (PRILOSEC) 20 MG capsule Take 20 mg by mouth daily.    . predniSONE (STERAPRED UNI-PAK 21 TAB) 5 MG (21) TBPK tablet Take 5 mg by mouth daily as needed (Back pain).    . tamsulosin (FLOMAX) 0.4 MG CAPS capsule Take 0.4 mg by mouth daily.    . TURMERIC-GINGER PO Take 2 tablets by mouth See admin instructions. Five times a week    . ibuprofen (ADVIL) 200 MG tablet Take 400 mg by mouth daily as needed for headache. (Patient not taking: Reported on 07/04/2020)     No current facility-administered medications for this visit.    (Not in a hospital admission)   Family History  Problem Relation  Age of Onset  . Heart attack Mother   . Hypertension Mother   . Hypertension Father   . Heart attack Father   . Stroke Father      Review of Systems:   Review of Systems  Constitutional: Negative.   Respiratory: Positive for shortness of breath.   Cardiovascular: Positive for chest pain.  Gastrointestinal: Positive for heartburn.  Musculoskeletal: Negative.   Psychiatric/Behavioral: The patient is nervous/anxious.       Physical Exam: BP 125/77 (BP Location: Right Arm, Patient Position: Sitting, Cuff Size: Normal)   Pulse (!) 59   Temp 97.7 F (36.5 C) (Skin)   Resp 20   Ht 5\' 7"  (1.702 m)   Wt 200 lb (90.7 kg)   SpO2 95% Comment: RA  BMI 31.32 kg/m  Physical Exam Constitutional:       General: He is not in acute distress.    Appearance: Normal appearance. He is normal weight. He is not ill-appearing.  HENT:     Head: Atraumatic.  Eyes:     Extraocular Movements: Extraocular movements intact.  Cardiovascular:     Rate and Rhythm: Normal rate and regular rhythm.     Pulses: Normal pulses.     Comments: Normal Allen's test in the left wrist. Pulmonary:     Effort: Pulmonary effort is normal. No respiratory distress.  Abdominal:     General: There is no distension.  Musculoskeletal:        General: Normal range of motion.     Cervical back: Normal range of motion.  Skin:    General: Skin is warm and dry.  Neurological:     General: No focal deficit present.     Mental Status: He is alert and oriented to person, place, and time.       Diagnostic Studies & Laboratory data:    Left Heart Catherization: Left heart cath was reviewed.  Severe left main disease.  No significant right-sided disease.  Good distal targets. Echo: Normal ejection fraction with no significant valvular disease.   I have independently reviewed the above radiologic studies and discussed with the patient   Recent Lab Findings: Lab Results  Component Value Date   WBC 4.5 07/02/2020   HGB 16.2 07/02/2020   HCT 46.8 07/02/2020   PLT 175 07/02/2020   GLUCOSE 115 (H) 06/28/2020   CHOL 143 06/28/2020   TRIG 60 06/28/2020   HDL 41 06/28/2020   LDLDIRECT 82 06/28/2020   LDLCALC 90 06/28/2020   ALT 38 03/27/2013   AST 34 03/27/2013   NA 138 06/28/2020   K 3.9 06/28/2020   CL 99 06/28/2020   CREATININE 1.46 (H) 06/28/2020   BUN 16 06/28/2020   CO2 22 06/28/2020      Assessment / Plan:   60 year old male with severe left main disease presented for an elective left heart cath.  On review of his echo his LV and RV function are preserved and he has no significant valvular disease.  We discussed the risks and benefits of a left radial harvest along with a two-vessel artery bypass  grafting.  He is agreeable to proceed.  He is scheduled for July 09, 2020.     I  spent 40 minutes counseling the patient face to face.   July 11, 2020 07/04/2020 2:55 PM

## 2020-07-04 NOTE — H&P (View-Only) (Signed)
[address]       [place] [address]             [phone]        Fleet Contras [facility] Medical Record #[id number] Date of Birth: 08-08-1960  Referring: Elder Negus, MD Primary Care: Elias Else, MD Primary Cardiologist:No primary care provider on file.  Chief Complaint:    Chief Complaint  Patient presents with  . Coronary Artery Disease    Surgical consult, Cardiac Cath 07/02/20,  ECHO 06/11/20    History of Present Illness:     60 year old male referred by Dr. Odis Hollingshead for surgical evaluation of severe left main disease.  He has been experiencing some chest pain over the last several months and underwent an elective left heart cath which identified his disease.  Prior to this he was fairly active but has gradually slowing himself down.   Past Medical and Surgical History: Previous Chest Surgery: No Previous Chest Radiation: No Diabetes Mellitus: No.  HbA1C pending Creatinine: 1.46  Past Medical History:  Diagnosis Date  . BPH (benign prostatic hyperplasia)   . Hyperlipidemia   . Hypertension     Past Surgical History:  Procedure Laterality Date  . HERNIA REPAIR    . LEFT HEART CATH AND CORONARY ANGIOGRAPHY N/A 07/02/2020   Procedure: LEFT HEART CATH AND CORONARY ANGIOGRAPHY;  Surgeon: Elder Negus, MD;  Location: [facility];  Service: Cardiovascular;  Laterality: N/A;    Social History: Support: Presents to clinic with his wife  Social History   Tobacco Use  Smoking Status Never Smoker  Smokeless Tobacco Never Used    Social History   Substance and Sexual Activity  Alcohol Use Yes   Comment: occasional     No Known Allergies    Current Outpatient Medications  Medication Sig Dispense Refill  . aspirin EC 81 MG tablet Take 1 tablet (81 mg total) by mouth daily. Swallow whole. 90 tablet 3  . atorvastatin (LIPITOR) 80 MG tablet Take 1 tablet (80 mg total) by mouth at bedtime. (Patient taking  differently: Take 80 mg by mouth at bedtime.) 90 tablet 0  . famotidine (PEPCID) 40 MG tablet Take 40 mg by mouth daily.    Marland Kitchen losartan-hydrochlorothiazide (HYZAAR) 100-12.5 MG tablet Take 1 tablet by mouth daily.    . metoprolol tartrate (LOPRESSOR) 25 MG tablet Take 1 tablet (25 mg total) by mouth in the morning, at noon, and at bedtime. 90 tablet 0  . nitroGLYCERIN (NITROSTAT) 0.4 MG SL tablet Place 1 tablet (0.4 mg total) under the tongue every 5 (five) minutes as needed for chest pain. If you require more than two tablets five minutes apart go to the nearest ER via EMS. 30 tablet 0  . omeprazole (PRILOSEC) 20 MG capsule Take 20 mg by mouth daily.    . predniSONE (STERAPRED UNI-PAK 21 TAB) 5 MG (21) TBPK tablet Take 5 mg by mouth daily as needed (Back pain).    . tamsulosin (FLOMAX) 0.4 MG CAPS capsule Take 0.4 mg by mouth daily.    . TURMERIC-GINGER PO Take 2 tablets by mouth See admin instructions. Five times a week    . ibuprofen (ADVIL) 200 MG tablet Take 400 mg by mouth daily as needed for headache. (Patient not taking: Reported on 07/04/2020)     No current facility-administered medications for this visit.    (Not in a hospital admission)   Family History  Problem Relation  Age of Onset  . Heart attack Mother   . Hypertension Mother   . Hypertension Father   . Heart attack Father   . Stroke Father      Review of Systems:   Review of Systems  Constitutional: Negative.   Respiratory: Positive for shortness of breath.   Cardiovascular: Positive for chest pain.  Gastrointestinal: Positive for heartburn.  Musculoskeletal: Negative.   Psychiatric/Behavioral: The patient is nervous/anxious.       Physical Exam: BP 125/77 (BP Location: Right Arm, Patient Position: Sitting, Cuff Size: Normal)   Pulse (!) 59   Temp 97.7 F (36.5 C) (Skin)   Resp 20   Ht 5' 7" (1.702 m)   Wt 200 lb (90.7 kg)   SpO2 95% Comment: RA  BMI 31.32 kg/m  Physical Exam Constitutional:       General: He is not in acute distress.    Appearance: Normal appearance. He is normal weight. He is not ill-appearing.  HENT:     Head: Atraumatic.  Eyes:     Extraocular Movements: Extraocular movements intact.  Cardiovascular:     Rate and Rhythm: Normal rate and regular rhythm.     Pulses: Normal pulses.     Comments: Normal Allen's test in the left wrist. Pulmonary:     Effort: Pulmonary effort is normal. No respiratory distress.  Abdominal:     General: There is no distension.  Musculoskeletal:        General: Normal range of motion.     Cervical back: Normal range of motion.  Skin:    General: Skin is warm and dry.  Neurological:     General: No focal deficit present.     Mental Status: He is alert and oriented to person, place, and time.       Diagnostic Studies & Laboratory data:    Left Heart Catherization: Left heart cath was reviewed.  Severe left main disease.  No significant right-sided disease.  Good distal targets. Echo: Normal ejection fraction with no significant valvular disease.   I have independently reviewed the above radiologic studies and discussed with the patient   Recent Lab Findings: Lab Results  Component Value Date   WBC 4.5 07/02/2020   HGB 16.2 07/02/2020   HCT 46.8 07/02/2020   PLT 175 07/02/2020   GLUCOSE 115 (H) 06/28/2020   CHOL 143 06/28/2020   TRIG 60 06/28/2020   HDL 41 06/28/2020   LDLDIRECT 82 06/28/2020   LDLCALC 90 06/28/2020   ALT 38 03/27/2013   AST 34 03/27/2013   NA 138 06/28/2020   K 3.9 06/28/2020   CL 99 06/28/2020   CREATININE 1.46 (H) 06/28/2020   BUN 16 06/28/2020   CO2 22 06/28/2020      Assessment / Plan:   59-year-old male with severe left main disease presented for an elective left heart cath.  On review of his echo his LV and RV function are preserved and he has no significant valvular disease.  We discussed the risks and benefits of a left radial harvest along with a two-vessel artery bypass  grafting.  He is agreeable to proceed.  He is scheduled for July 09, 2020.     I  spent 40 minutes counseling the patient face to face.   Haelie Clapp O Darrell Hauk 07/04/2020 2:55 PM       

## 2020-07-05 ENCOUNTER — Ambulatory Visit (HOSPITAL_COMMUNITY)
Admission: RE | Admit: 2020-07-05 | Discharge: 2020-07-05 | Disposition: A | Discharge status: Home / Self Care | Payer: Federal, State, Local not specified - PPO | Source: Ambulatory Visit | Attending: Thoracic Surgery (Cardiothoracic Vascular Surgery) | Admitting: Thoracic Surgery (Cardiothoracic Vascular Surgery)

## 2020-07-05 ENCOUNTER — Encounter (HOSPITAL_COMMUNITY): Payer: Self-pay

## 2020-07-05 ENCOUNTER — Encounter (HOSPITAL_COMMUNITY)
Admission: RE | Admit: 2020-07-05 | Discharge: 2020-07-05 | Disposition: A | Discharge status: Home / Self Care | Payer: Federal, State, Local not specified - PPO | Source: Ambulatory Visit | Attending: Thoracic Surgery (Cardiothoracic Vascular Surgery) | Admitting: Thoracic Surgery (Cardiothoracic Vascular Surgery)

## 2020-07-05 ENCOUNTER — Other Ambulatory Visit (HOSPITAL_COMMUNITY)
Admission: RE | Admit: 2020-07-05 | Discharge: 2020-07-05 | Disposition: A | Discharge status: Home / Self Care | Payer: Federal, State, Local not specified - PPO | Source: Ambulatory Visit

## 2020-07-05 DIAGNOSIS — I2510 Atherosclerotic heart disease of native coronary artery without angina pectoris: Secondary | ICD-10-CM

## 2020-07-05 DIAGNOSIS — I10 Essential (primary) hypertension: Secondary | ICD-10-CM | POA: Diagnosis not present

## 2020-07-05 DIAGNOSIS — I779 Disorder of arteries and arterioles, unspecified: Secondary | ICD-10-CM | POA: Diagnosis not present

## 2020-07-05 DIAGNOSIS — Z01818 Encounter for other preprocedural examination: Secondary | ICD-10-CM | POA: Diagnosis not present

## 2020-07-05 DIAGNOSIS — E785 Hyperlipidemia, unspecified: Secondary | ICD-10-CM | POA: Diagnosis not present

## 2020-07-05 DIAGNOSIS — Z951 Presence of aortocoronary bypass graft: Secondary | ICD-10-CM | POA: Diagnosis not present

## 2020-07-05 DIAGNOSIS — Z20822 Contact with and (suspected) exposure to covid-19: Secondary | ICD-10-CM | POA: Insufficient documentation

## 2020-07-05 HISTORY — DX: Atherosclerotic heart disease of native coronary artery without angina pectoris: I25.10

## 2020-07-05 HISTORY — DX: Gastro-esophageal reflux disease without esophagitis: K21.9

## 2020-07-05 LAB — BLOOD GAS, ARTERIAL
Acid-Base Excess: 2.6 mmol/L — ABNORMAL HIGH (ref 0.0–2.0)
Bicarbonate: 26.8 mmol/L (ref 20.0–28.0)
Drawn by: 587931
FIO2: 21
O2 Saturation: 97.3 %
Patient temperature: 37
pCO2 arterial: 42.6 mmHg (ref 32.0–48.0)
pH, Arterial: 7.415 (ref 7.350–7.450)
pO2, Arterial: 94.5 mmHg (ref 83.0–108.0)

## 2020-07-05 LAB — PROTIME-INR
INR: 1 (ref 0.8–1.2)
Prothrombin Time: 13.2 seconds (ref 11.4–15.2)

## 2020-07-05 LAB — COMPREHENSIVE METABOLIC PANEL
ALT: 21 U/L (ref 0–44)
AST: 22 U/L (ref 15–41)
Albumin: 3.9 g/dL (ref 3.5–5.0)
Alkaline Phosphatase: 63 U/L (ref 38–126)
Anion gap: 10 (ref 5–15)
BUN: 12 mg/dL (ref 8–23)
CO2: 22 mmol/L (ref 22–32)
Calcium: 9.2 mg/dL (ref 8.9–10.3)
Chloride: 104 mmol/L (ref 98–111)
Creatinine, Ser: 1.3 mg/dL — ABNORMAL HIGH (ref 0.61–1.24)
GFR, Estimated: >60 mL/min (ref >60)
Glucose, Bld: 85 mg/dL (ref 70–99)
Potassium: 3.6 mmol/L (ref 3.5–5.1)
Sodium: 136 mmol/L (ref 135–145)
Total Bilirubin: 1 mg/dL (ref 0.3–1.2)
Total Protein: 6.8 g/dL (ref 6.5–8.1)

## 2020-07-05 LAB — CBC
HCT: 44.7 % (ref 39.0–52.0)
Hemoglobin: 15.5 g/dL (ref 13.0–17.0)
MCH: 28.4 pg (ref 26.0–34.0)
MCHC: 34.7 g/dL (ref 30.0–36.0)
MCV: 81.9 fL (ref 80.0–100.0)
Platelets: 187 10*3/uL (ref 150–400)
RBC: 5.46 MIL/uL (ref 4.22–5.81)
RDW: 11.9 % (ref 11.5–15.5)
WBC: 4.4 10*3/uL (ref 4.0–10.5)
nRBC: 0 % (ref 0.0–0.2)

## 2020-07-05 LAB — URINALYSIS, ROUTINE W REFLEX MICROSCOPIC
Bilirubin Urine: NEGATIVE
Glucose, UA: NEGATIVE mg/dL
Hgb urine dipstick: NEGATIVE
Ketones, ur: NEGATIVE mg/dL
Leukocytes,Ua: NEGATIVE
Nitrite: NEGATIVE
Protein, ur: NEGATIVE mg/dL
Specific Gravity, Urine: 1.008 (ref 1.005–1.030)
pH: 6 (ref 5.0–8.0)

## 2020-07-05 LAB — TYPE AND SCREEN
ABO/RH(D): O POS
Antibody Screen: NEGATIVE

## 2020-07-05 LAB — SURGICAL PCR SCREEN

## 2020-07-05 LAB — HEMOGLOBIN A1C
Hgb A1c MFr Bld: 5.5 % (ref 4.8–5.6)
Mean Plasma Glucose: 111.15 mg/dL

## 2020-07-05 LAB — APTT: aPTT: 29 seconds (ref 24–36)

## 2020-07-05 NOTE — Pre-Procedure Instructions (Signed)
Brian Curry  07/05/2020    Your procedure is scheduled on Tuesday, July 09, 2020 at 7:30 AM.   Report to [facility] Entrance "A" Admitting Office at 5:30 AM.   Call this number if you have problems the morning of surgery: [phone]   Questions prior to day of surgery, please call [phone] between 8 & 4 PM.   Remember:  Do not eat or drink after midnight Monday, 07/08/20  Take these medicines the morning of surgery with A SIP OF WATER: Metoprolol (Lopressor), Nitroglycerin - if needed  Continue your 81 mg Aspirin prior to surgery, do not take day of surgery.  Stop Herbal medications (Turmeric-Ginger) as of today prior to surgery. Do not use other Herbal medications, NSAIDS (Ibuprofen, Aleve, etc), other Aspirin containing products, Vitamins or Fish Oil prior to surgery.   Do not wear jewelry.  Do not wear lotions, powders, cologne or deodorant.  Men may shave face and neck.  Do not bring valuables to the hospital.  [facility] is not responsible for any belongings or valuables.  Contacts, dentures or bridgework may not be worn into surgery.  Leave your suitcase in the car.  After surgery it may be brought to your room.  For patients admitted to the hospital, discharge time will be determined by your treatment team.  [facility] - Preparing for Surgery  Before surgery, you can play an important role.  Because skin is not sterile, your skin needs to be as free of germs as possible.  You can reduce the number of germs on you skin by washing with CHG (chlorahexidine gluconate) soap before surgery.  CHG is an antiseptic cleaner which kills germs and bonds with the skin to continue killing germs even after washing.  Oral Hygiene is also important in reducing the risk of infection.  Remember to brush your teeth with your regular toothpaste the morning of surgery.  Please DO NOT use if you have an allergy to CHG or antibacterial soaps.  If your skin becomes  reddened/irritated stop using the CHG and inform your nurse when you arrive at Short Stay.  Do not shave (including legs and underarms) for at least 48 hours prior to the first CHG shower.  You may shave your face.  Please follow these instructions carefully:   1.  Shower with CHG Soap the night before surgery and the morning of Surgery.  2.  If you choose to wash your hair, wash your hair first as usual with your normal shampoo.  3.  After you shampoo, rinse your hair and body thoroughly to remove the shampoo. 4.  Use CHG as you would any other liquid soap.  You can apply chg directly to the skin and wash gently with a      scrungie or washcloth.           5.  Apply the CHG Soap to your body ONLY FROM THE NECK DOWN.   Do not use on open wounds or open sores. Avoid contact with your eyes, ears, mouth and genitals (private parts).  Wash genitals (private parts) with your normal soap, do this prior to using CHG soap.  6.  Wash thoroughly, paying special attention to the area where your surgery will be performed.  7.  Thoroughly rinse your body with warm water from the neck down.  8.  DO NOT shower/wash with your normal soap after using and rinsing off the CHG Soap.  9.  Pat yourself dry with  a clean towel.            10.  Wear clean pajamas.            11.  Place clean sheets on your bed the night of your first shower and do not sleep with pets.  Day of Surgery  Shower as above.  Do not apply any lotions/deodorants the morning of surgery.   Please wear clean clothes to the hospital. Remember to brush your teeth with toothpaste.  Please read over the fact sheets that you were given.

## 2020-07-05 NOTE — Progress Notes (Signed)
PCP - Dr. Nicholos Johns Cardiologist - Dr. Odis Hollingshead  Chest x-ray - today EKG - 06/27/20 ECHO - 06/15/20 Cardiac Cath - 07/02/20  Aspirin Instructions: continue Aspirin, do not take day of surgery   COVID TEST- done today   Anesthesia review: yes, Fayrene Fearing saw during PAT due to pt having chest pain  Patient denies fever and cough at PAT appointment. Pt states he started having pain after cleaning his car with spraying water on it prior to arrival for his PAT appt. States it's heaviness/burning sensation along with "slight" shortness of breath. Took 1 NTG about an hour prior to arriving. States it didn't really help. Pt denies nausea or diaphoresis. Pt states it's not any different than what he has been feeling for quite awhile now. We did instruct pt to call 911 if chest pain changes, gets worse, he gets diaphoretic, nauseated or shortness of breath gets worse. He voiced understanding.    All instructions explained to the patient, with a verbal understanding of the material. Patient agrees to go over the instructions while at home for a better understanding. Patient also instructed to self quarantine after being tested for COVID-19. The opportunity to ask questions was provided.

## 2020-07-05 NOTE — Progress Notes (Signed)
Pre-CABG completed.   Please see CV Proc for preliminary results.   Freeda Spivey, RVT  

## 2020-07-06 LAB — SARS CORONAVIRUS 2 (TAT 6-24 HRS): SARS Coronavirus 2: NEGATIVE

## 2020-07-08 MED ORDER — MANNITOL 20 % IV SOLN
Freq: Once | INTRAVENOUS | Status: DC
  Filled 2020-07-08: qty 13

## 2020-07-08 MED ORDER — SODIUM CHLORIDE 0.9 % IV SOLN
1.5000 g | INTRAVENOUS | Status: AC
  Administered 2020-07-09: 1.5 g via INTRAVENOUS
  Filled 2020-07-08: qty 1.5

## 2020-07-08 MED ORDER — PHENYLEPHRINE HCL-NACL 20-0.9 MG/250ML-% IV SOLN
30.0000 ug/min | INTRAVENOUS | Status: AC
  Administered 2020-07-09: 25 ug/min via INTRAVENOUS
  Filled 2020-07-08: qty 250

## 2020-07-08 MED ORDER — SODIUM CHLORIDE 0.9 % IV SOLN
INTRAVENOUS | Status: DC
  Filled 2020-07-08: qty 30

## 2020-07-08 MED ORDER — TRANEXAMIC ACID 1000 MG/10ML IV SOLN
1.5000 mg/kg/h | INTRAVENOUS | Status: AC
  Administered 2020-07-09: 1.5 mg/kg/h via INTRAVENOUS
  Filled 2020-07-08: qty 25

## 2020-07-08 MED ORDER — VANCOMYCIN HCL 1500 MG/300ML IV SOLN
1500.0000 mg | INTRAVENOUS | Status: AC
  Administered 2020-07-09: 1500 mg via INTRAVENOUS
  Filled 2020-07-08: qty 300

## 2020-07-08 MED ORDER — INSULIN REGULAR(HUMAN) IN NACL 100-0.9 UT/100ML-% IV SOLN
INTRAVENOUS | Status: AC
  Administered 2020-07-09: 1.1 [IU]/h via INTRAVENOUS
  Filled 2020-07-08: qty 100

## 2020-07-08 MED ORDER — NITROGLYCERIN IN D5W 200-5 MCG/ML-% IV SOLN
2.0000 ug/min | INTRAVENOUS | Status: DC
  Filled 2020-07-08: qty 250

## 2020-07-08 MED ORDER — MILRINONE LACTATE IN DEXTROSE 20-5 MG/100ML-% IV SOLN
0.3000 ug/kg/min | INTRAVENOUS | Status: DC
  Filled 2020-07-08: qty 100

## 2020-07-08 MED ORDER — TRANEXAMIC ACID (OHS) PUMP PRIME SOLUTION
2.0000 mg/kg | INTRAVENOUS | Status: DC
  Filled 2020-07-08: qty 1.8

## 2020-07-08 MED ORDER — DEXMEDETOMIDINE HCL IN NACL 400 MCG/100ML IV SOLN
0.1000 ug/kg/h | INTRAVENOUS | Status: AC
  Administered 2020-07-09: .7 ug/kg/h via INTRAVENOUS
  Filled 2020-07-08: qty 100

## 2020-07-08 MED ORDER — EPINEPHRINE HCL 5 MG/250ML IV SOLN IN NS
0.0000 ug/min | INTRAVENOUS | Status: DC
Start: 2020-07-09 — End: 2020-07-09
  Filled 2020-07-08: qty 250

## 2020-07-08 MED ORDER — PLASMA-LYTE 148 IV SOLN
INTRAVENOUS | Status: DC
  Filled 2020-07-08: qty 2.5

## 2020-07-08 MED ORDER — TRANEXAMIC ACID (OHS) BOLUS VIA INFUSION
15.0000 mg/kg | INTRAVENOUS | Status: AC
  Administered 2020-07-09: 1350 mg via INTRAVENOUS
  Filled 2020-07-08: qty 1350

## 2020-07-08 MED ORDER — NOREPINEPHRINE 4 MG/250ML-% IV SOLN
0.0000 ug/min | INTRAVENOUS | Status: DC
  Filled 2020-07-08: qty 250

## 2020-07-08 MED ORDER — SODIUM CHLORIDE 0.9 % IV SOLN
750.0000 mg | INTRAVENOUS | Status: AC
  Administered 2020-07-09: 750 mg via INTRAVENOUS
  Filled 2020-07-08: qty 750

## 2020-07-08 MED ORDER — POTASSIUM CHLORIDE 2 MEQ/ML IV SOLN
80.0000 meq | INTRAVENOUS | Status: DC
  Filled 2020-07-08: qty 40

## 2020-07-09 ENCOUNTER — Inpatient Hospital Stay (HOSPITAL_COMMUNITY)
Admission: RE | Disposition: A | Discharge status: Home / Self Care | Payer: Self-pay | Source: Home / Self Care | Attending: Thoracic Surgery (Cardiothoracic Vascular Surgery)

## 2020-07-09 ENCOUNTER — Inpatient Hospital Stay (HOSPITAL_COMMUNITY): Payer: Federal, State, Local not specified - PPO

## 2020-07-09 ENCOUNTER — Encounter (HOSPITAL_COMMUNITY): Payer: Self-pay | Admitting: Thoracic Surgery (Cardiothoracic Vascular Surgery)

## 2020-07-09 ENCOUNTER — Inpatient Hospital Stay (HOSPITAL_COMMUNITY)
Admission: RE | Admit: 2020-07-09 | Discharge: 2020-07-13 | DRG: 236 | Disposition: A | Discharge status: Home / Self Care | Payer: Federal, State, Local not specified - PPO | Attending: Thoracic Surgery (Cardiothoracic Vascular Surgery) | Admitting: Thoracic Surgery (Cardiothoracic Vascular Surgery)

## 2020-07-09 ENCOUNTER — Other Ambulatory Visit: Payer: Self-pay

## 2020-07-09 ENCOUNTER — Inpatient Hospital Stay (HOSPITAL_COMMUNITY): Payer: Federal, State, Local not specified - PPO | Admitting: Physician Assistant

## 2020-07-09 DIAGNOSIS — E785 Hyperlipidemia, unspecified: Secondary | ICD-10-CM | POA: Diagnosis present

## 2020-07-09 DIAGNOSIS — I10 Essential (primary) hypertension: Secondary | ICD-10-CM | POA: Diagnosis present

## 2020-07-09 DIAGNOSIS — N400 Enlarged prostate without lower urinary tract symptoms: Secondary | ICD-10-CM | POA: Diagnosis not present

## 2020-07-09 DIAGNOSIS — D6959 Other secondary thrombocytopenia: Secondary | ICD-10-CM | POA: Diagnosis not present

## 2020-07-09 DIAGNOSIS — Z79899 Other long term (current) drug therapy: Secondary | ICD-10-CM

## 2020-07-09 DIAGNOSIS — I2510 Atherosclerotic heart disease of native coronary artery without angina pectoris: Secondary | ICD-10-CM | POA: Diagnosis not present

## 2020-07-09 DIAGNOSIS — Z951 Presence of aortocoronary bypass graft: Secondary | ICD-10-CM | POA: Diagnosis not present

## 2020-07-09 DIAGNOSIS — K5900 Constipation, unspecified: Secondary | ICD-10-CM | POA: Diagnosis not present

## 2020-07-09 DIAGNOSIS — I25118 Atherosclerotic heart disease of native coronary artery with other forms of angina pectoris: Principal | ICD-10-CM | POA: Diagnosis present

## 2020-07-09 DIAGNOSIS — Z4682 Encounter for fitting and adjustment of non-vascular catheter: Secondary | ICD-10-CM | POA: Diagnosis not present

## 2020-07-09 DIAGNOSIS — Z20822 Contact with and (suspected) exposure to covid-19: Secondary | ICD-10-CM | POA: Diagnosis present

## 2020-07-09 DIAGNOSIS — K219 Gastro-esophageal reflux disease without esophagitis: Secondary | ICD-10-CM | POA: Diagnosis present

## 2020-07-09 DIAGNOSIS — J90 Pleural effusion, not elsewhere classified: Secondary | ICD-10-CM

## 2020-07-09 DIAGNOSIS — I25110 Atherosclerotic heart disease of native coronary artery with unstable angina pectoris: Secondary | ICD-10-CM | POA: Diagnosis not present

## 2020-07-09 DIAGNOSIS — Z7982 Long term (current) use of aspirin: Secondary | ICD-10-CM | POA: Diagnosis not present

## 2020-07-09 DIAGNOSIS — E8770 Fluid overload, unspecified: Secondary | ICD-10-CM | POA: Diagnosis not present

## 2020-07-09 DIAGNOSIS — Z8249 Family history of ischemic heart disease and other diseases of the circulatory system: Secondary | ICD-10-CM

## 2020-07-09 DIAGNOSIS — D62 Acute posthemorrhagic anemia: Secondary | ICD-10-CM | POA: Diagnosis not present

## 2020-07-09 DIAGNOSIS — I081 Rheumatic disorders of both mitral and tricuspid valves: Secondary | ICD-10-CM | POA: Diagnosis not present

## 2020-07-09 HISTORY — PX: CORONARY ARTERY BYPASS GRAFT: SHX141

## 2020-07-09 HISTORY — PX: RADIAL ARTERY HARVEST: SHX5067

## 2020-07-09 HISTORY — PX: TEE WITHOUT CARDIOVERSION: SHX5443

## 2020-07-09 LAB — POCT I-STAT 7, (LYTES, BLD GAS, ICA,H+H)
Acid-Base Excess: 4 mmol/L — ABNORMAL HIGH (ref 0.0–2.0)
Acid-base deficit: 1 mmol/L (ref 0.0–2.0)
Acid-base deficit: 1 mmol/L (ref 0.0–2.0)
Acid-base deficit: 1 mmol/L (ref 0.0–2.0)
Acid-base deficit: 4 mmol/L — ABNORMAL HIGH (ref 0.0–2.0)
Bicarbonate: 28.4 mmol/L — ABNORMAL HIGH (ref 20.0–28.0)
Bicarbonate: 24.3 mmol/L (ref 20.0–28.0)
Bicarbonate: 19.9 mmol/L — ABNORMAL LOW (ref 20.0–28.0)
Bicarbonate: 21 mmol/L (ref 20.0–28.0)
Bicarbonate: 21.4 mmol/L (ref 20.0–28.0)
Calcium, Ion: 0.96 mmol/L — ABNORMAL LOW (ref 1.15–1.40)
Calcium, Ion: 1.25 mmol/L (ref 1.15–1.40)
Calcium, Ion: 1.1 mmol/L — ABNORMAL LOW (ref 1.15–1.40)
Calcium, Ion: 1.13 mmol/L — ABNORMAL LOW (ref 1.15–1.40)
Calcium, Ion: 1.15 mmol/L (ref 1.15–1.40)
HCT: 27 % — ABNORMAL LOW (ref 39.0–52.0)
HCT: 34 % — ABNORMAL LOW (ref 39.0–52.0)
HCT: 34 % — ABNORMAL LOW (ref 39.0–52.0)
HCT: 35 % — ABNORMAL LOW (ref 39.0–52.0)
HCT: 33 % — ABNORMAL LOW (ref 39.0–52.0)
Hemoglobin: 9.2 g/dL — ABNORMAL LOW (ref 13.0–17.0)
Hemoglobin: 11.6 g/dL — ABNORMAL LOW (ref 13.0–17.0)
Hemoglobin: 11.6 g/dL — ABNORMAL LOW (ref 13.0–17.0)
Hemoglobin: 11.9 g/dL — ABNORMAL LOW (ref 13.0–17.0)
Hemoglobin: 11.2 g/dL — ABNORMAL LOW (ref 13.0–17.0)
O2 Saturation: 100 %
O2 Saturation: 98 %
O2 Saturation: 99 %
O2 Saturation: 99 %
O2 Saturation: 97 %
Patient temperature: 35.4
Patient temperature: 34.5
Patient temperature: 37.7
Patient temperature: 38.7
Potassium: 3.5 mmol/L (ref 3.5–5.1)
Potassium: 3.7 mmol/L (ref 3.5–5.1)
Potassium: 3.9 mmol/L (ref 3.5–5.1)
Potassium: 3.8 mmol/L (ref 3.5–5.1)
Potassium: 3.6 mmol/L (ref 3.5–5.1)
Sodium: 138 mmol/L (ref 135–145)
Sodium: 140 mmol/L (ref 135–145)
Sodium: 138 mmol/L (ref 135–145)
Sodium: 139 mmol/L (ref 135–145)
Sodium: 141 mmol/L (ref 135–145)
TCO2: 30 mmol/L (ref 22–32)
TCO2: 26 mmol/L (ref 22–32)
TCO2: 21 mmol/L — ABNORMAL LOW (ref 22–32)
TCO2: 22 mmol/L (ref 22–32)
TCO2: 23 mmol/L (ref 22–32)
pCO2 arterial: 42.9 mmHg (ref 32.0–48.0)
pCO2 arterial: 40.1 mmHg (ref 32.0–48.0)
pCO2 arterial: 21.1 mmHg — ABNORMAL LOW (ref 32.0–48.0)
pCO2 arterial: 27.6 mmHg — ABNORMAL LOW (ref 32.0–48.0)
pCO2 arterial: 41.6 mmHg (ref 32.0–48.0)
pH, Arterial: 7.429 (ref 7.350–7.450)
pH, Arterial: 7.383 (ref 7.350–7.450)
pH, Arterial: 7.574 — ABNORMAL HIGH (ref 7.350–7.450)
pH, Arterial: 7.493 — ABNORMAL HIGH (ref 7.350–7.450)
pH, Arterial: 7.328 — ABNORMAL LOW (ref 7.350–7.450)
pO2, Arterial: 392 mmHg — ABNORMAL HIGH (ref 83.0–108.0)
pO2, Arterial: 97 mmHg (ref 83.0–108.0)
pO2, Arterial: 102 mmHg (ref 83.0–108.0)
pO2, Arterial: 143 mmHg — ABNORMAL HIGH (ref 83.0–108.0)
pO2, Arterial: 108 mmHg (ref 83.0–108.0)

## 2020-07-09 LAB — ECHO INTRAOPERATIVE TEE
AV Mean grad: 3 mmHg
AV Peak grad: 5.9 mmHg
Ao pk vel: 1.21 m/s
Calc EF: 68.3 %
Height: 67 in
Single Plane A2C EF: 72.1 %
Single Plane A4C EF: 66.4 %
Weight: 3200 oz

## 2020-07-09 LAB — GLUCOSE, CAPILLARY
Glucose-Capillary: 120 mg/dL — ABNORMAL HIGH (ref 70–99)
Glucose-Capillary: 97 mg/dL (ref 70–99)
Glucose-Capillary: 144 mg/dL — ABNORMAL HIGH (ref 70–99)
Glucose-Capillary: 128 mg/dL — ABNORMAL HIGH (ref 70–99)
Glucose-Capillary: 135 mg/dL — ABNORMAL HIGH (ref 70–99)
Glucose-Capillary: 123 mg/dL — ABNORMAL HIGH (ref 70–99)
Glucose-Capillary: 180 mg/dL — ABNORMAL HIGH (ref 70–99)
Glucose-Capillary: 163 mg/dL — ABNORMAL HIGH (ref 70–99)
Glucose-Capillary: 141 mg/dL — ABNORMAL HIGH (ref 70–99)

## 2020-07-09 LAB — POCT I-STAT EG7
Acid-Base Excess: 0 mmol/L (ref 0.0–2.0)
Bicarbonate: 25.6 mmol/L (ref 20.0–28.0)
Calcium, Ion: 0.99 mmol/L — ABNORMAL LOW (ref 1.15–1.40)
HCT: 27 % — ABNORMAL LOW (ref 39.0–52.0)
Hemoglobin: 9.2 g/dL — ABNORMAL LOW (ref 13.0–17.0)
O2 Saturation: 84 %
Potassium: 5.4 mmol/L — ABNORMAL HIGH (ref 3.5–5.1)
Sodium: 137 mmol/L (ref 135–145)
TCO2: 27 mmol/L (ref 22–32)
pCO2, Ven: 43.9 mmHg — ABNORMAL LOW (ref 44.0–60.0)
pH, Ven: 7.374 (ref 7.250–7.430)
pO2, Ven: 50 mmHg — ABNORMAL HIGH (ref 32.0–45.0)

## 2020-07-09 LAB — POCT I-STAT, CHEM 8
BUN: 11 mg/dL (ref 8–23)
BUN: 10 mg/dL (ref 8–23)
BUN: 10 mg/dL (ref 8–23)
BUN: 10 mg/dL (ref 8–23)
Calcium, Ion: 1.21 mmol/L (ref 1.15–1.40)
Calcium, Ion: 1.16 mmol/L (ref 1.15–1.40)
Calcium, Ion: 1.03 mmol/L — ABNORMAL LOW (ref 1.15–1.40)
Calcium, Ion: 1.28 mmol/L (ref 1.15–1.40)
Chloride: 99 mmol/L (ref 98–111)
Chloride: 100 mmol/L (ref 98–111)
Chloride: 99 mmol/L (ref 98–111)
Chloride: 103 mmol/L (ref 98–111)
Creatinine, Ser: 1.1 mg/dL (ref 0.61–1.24)
Creatinine, Ser: 1 mg/dL (ref 0.61–1.24)
Creatinine, Ser: 0.9 mg/dL (ref 0.61–1.24)
Creatinine, Ser: 0.9 mg/dL (ref 0.61–1.24)
Glucose, Bld: 115 mg/dL — ABNORMAL HIGH (ref 70–99)
Glucose, Bld: 127 mg/dL — ABNORMAL HIGH (ref 70–99)
Glucose, Bld: 125 mg/dL — ABNORMAL HIGH (ref 70–99)
Glucose, Bld: 123 mg/dL — ABNORMAL HIGH (ref 70–99)
HCT: 38 % — ABNORMAL LOW (ref 39.0–52.0)
HCT: 36 % — ABNORMAL LOW (ref 39.0–52.0)
HCT: 27 % — ABNORMAL LOW (ref 39.0–52.0)
HCT: 28 % — ABNORMAL LOW (ref 39.0–52.0)
Hemoglobin: 12.9 g/dL — ABNORMAL LOW (ref 13.0–17.0)
Hemoglobin: 12.2 g/dL — ABNORMAL LOW (ref 13.0–17.0)
Hemoglobin: 9.2 g/dL — ABNORMAL LOW (ref 13.0–17.0)
Hemoglobin: 9.5 g/dL — ABNORMAL LOW (ref 13.0–17.0)
Potassium: 3.4 mmol/L — ABNORMAL LOW (ref 3.5–5.1)
Potassium: 3.3 mmol/L — ABNORMAL LOW (ref 3.5–5.1)
Potassium: 4.3 mmol/L (ref 3.5–5.1)
Potassium: 3.9 mmol/L (ref 3.5–5.1)
Sodium: 141 mmol/L (ref 135–145)
Sodium: 139 mmol/L (ref 135–145)
Sodium: 136 mmol/L (ref 135–145)
Sodium: 138 mmol/L (ref 135–145)
TCO2: 30 mmol/L (ref 22–32)
TCO2: 25 mmol/L (ref 22–32)
TCO2: 27 mmol/L (ref 22–32)
TCO2: 26 mmol/L (ref 22–32)

## 2020-07-09 LAB — BASIC METABOLIC PANEL
Anion gap: 3 — ABNORMAL LOW (ref 5–15)
BUN: 9 mg/dL (ref 8–23)
CO2: 24 mmol/L (ref 22–32)
Calcium: 8 mg/dL — ABNORMAL LOW (ref 8.9–10.3)
Chloride: 109 mmol/L (ref 98–111)
Creatinine, Ser: 1.1 mg/dL (ref 0.61–1.24)
GFR, Estimated: >60 mL/min (ref >60)
Glucose, Bld: 132 mg/dL — ABNORMAL HIGH (ref 70–99)
Potassium: 3.7 mmol/L (ref 3.5–5.1)
Sodium: 136 mmol/L (ref 135–145)

## 2020-07-09 LAB — ABO/RH: ABO/RH(D): O POS

## 2020-07-09 LAB — CBC
HCT: 35.6 % — ABNORMAL LOW (ref 39.0–52.0)
HCT: 36.5 % — ABNORMAL LOW (ref 39.0–52.0)
Hemoglobin: 12.4 g/dL — ABNORMAL LOW (ref 13.0–17.0)
Hemoglobin: 12.6 g/dL — ABNORMAL LOW (ref 13.0–17.0)
MCH: 28.9 pg (ref 26.0–34.0)
MCH: 28.3 pg (ref 26.0–34.0)
MCHC: 34.8 g/dL (ref 30.0–36.0)
MCHC: 34.5 g/dL (ref 30.0–36.0)
MCV: 83 fL (ref 80.0–100.0)
MCV: 82 fL (ref 80.0–100.0)
Platelets: 110 10*3/uL — ABNORMAL LOW (ref 150–400)
Platelets: 124 10*3/uL — ABNORMAL LOW (ref 150–400)
RBC: 4.29 MIL/uL (ref 4.22–5.81)
RBC: 4.45 MIL/uL (ref 4.22–5.81)
RDW: 11.9 % (ref 11.5–15.5)
RDW: 11.9 % (ref 11.5–15.5)
WBC: 6.1 10*3/uL (ref 4.0–10.5)
WBC: 8.9 10*3/uL (ref 4.0–10.5)
nRBC: 0 % (ref 0.0–0.2)
nRBC: 0 % (ref 0.0–0.2)

## 2020-07-09 LAB — SURGICAL PCR SCREEN
MRSA, PCR: NEGATIVE
Staphylococcus aureus: NEGATIVE

## 2020-07-09 LAB — PLATELET COUNT: Platelets: 123 10*3/uL — ABNORMAL LOW (ref 150–400)

## 2020-07-09 LAB — APTT: aPTT: 30 seconds (ref 24–36)

## 2020-07-09 LAB — HEMOGLOBIN AND HEMATOCRIT, BLOOD
HCT: 28.5 % — ABNORMAL LOW (ref 39.0–52.0)
Hemoglobin: 10.1 g/dL — ABNORMAL LOW (ref 13.0–17.0)

## 2020-07-09 LAB — PROTIME-INR
INR: 1.4 — ABNORMAL HIGH (ref 0.8–1.2)
Prothrombin Time: 17.2 seconds — ABNORMAL HIGH (ref 11.4–15.2)

## 2020-07-09 LAB — MAGNESIUM: Magnesium: 2.9 mg/dL — ABNORMAL HIGH (ref 1.7–2.4)

## 2020-07-09 SURGERY — CORONARY ARTERY BYPASS GRAFTING (CABG)
Anesthesia: General | Site: Chest

## 2020-07-09 MED ORDER — SODIUM CHLORIDE 0.9 % IV SOLN: INTRAVENOUS | Status: DC

## 2020-07-09 MED ORDER — NITROGLYCERIN IN D5W 200-5 MCG/ML-% IV SOLN: 0.0000 ug/min | INTRAVENOUS | Status: DC

## 2020-07-09 MED ORDER — METOPROLOL TARTRATE 12.5 MG HALF TABLET
12.5000 mg | ORAL_TABLET | Freq: Two times a day (BID) | ORAL | Status: DC
  Administered 2020-07-09 – 2020-07-10 (×3): 12.5 mg via ORAL
  Filled 2020-07-09 (×4): qty 1

## 2020-07-09 MED ORDER — BISACODYL 10 MG RE SUPP: 10.0000 mg | Freq: Every day | RECTAL | Status: DC

## 2020-07-09 MED ORDER — ACETAMINOPHEN 160 MG/5ML PO SOLN: 1000.0000 mg | Freq: Four times a day (QID) | ORAL | Status: DC

## 2020-07-09 MED ORDER — NOREPINEPHRINE 4 MG/250ML-% IV SOLN
0.0000 ug/min | INTRAVENOUS | Status: DC
  Administered 2020-07-09: 10 ug/min via INTRAVENOUS

## 2020-07-09 MED ORDER — FAMOTIDINE IN NACL 20-0.9 MG/50ML-% IV SOLN
20.0000 mg | Freq: Two times a day (BID) | INTRAVENOUS | Status: AC
  Administered 2020-07-09 (×2): 20 mg via INTRAVENOUS
  Filled 2020-07-09 (×2): qty 50

## 2020-07-09 MED ORDER — LACTATED RINGERS IV SOLN: INTRAVENOUS | Status: DC | PRN

## 2020-07-09 MED ORDER — CHLORHEXIDINE GLUCONATE 0.12 % MT SOLN
15.0000 mL | Freq: Once | OROMUCOSAL | Status: AC
  Administered 2020-07-09: 15 mL via OROMUCOSAL

## 2020-07-09 MED ORDER — EPHEDRINE 5 MG/ML INJ
INTRAVENOUS | Status: AC
  Filled 2020-07-09: qty 10

## 2020-07-09 MED ORDER — ASPIRIN EC 325 MG PO TBEC
325.0000 mg | DELAYED_RELEASE_TABLET | Freq: Every day | ORAL | Status: DC
  Administered 2020-07-10 – 2020-07-13 (×4): 325 mg via ORAL
  Filled 2020-07-09 (×4): qty 1

## 2020-07-09 MED ORDER — ATORVASTATIN CALCIUM 80 MG PO TABS
80.0000 mg | ORAL_TABLET | Freq: Every day | ORAL | Status: DC
  Administered 2020-07-09 – 2020-07-12 (×4): 80 mg via ORAL
  Filled 2020-07-09 (×4): qty 1

## 2020-07-09 MED ORDER — SODIUM CHLORIDE (PF) 0.9 % IJ SOLN
OROMUCOSAL | Status: DC | PRN
  Administered 2020-07-09 (×3): 4 mL via TOPICAL

## 2020-07-09 MED ORDER — PROPOFOL 10 MG/ML IV BOLUS
INTRAVENOUS | Status: DC | PRN
  Administered 2020-07-09: 150 mg via INTRAVENOUS

## 2020-07-09 MED ORDER — TRAMADOL HCL 50 MG PO TABS
50.0000 mg | ORAL_TABLET | ORAL | Status: DC | PRN
  Administered 2020-07-10 – 2020-07-13 (×6): 100 mg via ORAL
  Filled 2020-07-09: qty 2
  Filled 2020-07-09: qty 1
  Filled 2020-07-09 (×6): qty 2

## 2020-07-09 MED ORDER — MAGNESIUM SULFATE 4 GM/100ML IV SOLN
4.0000 g | Freq: Once | INTRAVENOUS | Status: AC
  Administered 2020-07-09: 4 g via INTRAVENOUS
  Filled 2020-07-09: qty 100

## 2020-07-09 MED ORDER — ALBUMIN HUMAN 5 % IV SOLN: INTRAVENOUS | Status: DC | PRN

## 2020-07-09 MED ORDER — ROCURONIUM BROMIDE 10 MG/ML (PF) SYRINGE
PREFILLED_SYRINGE | INTRAVENOUS | Status: AC
  Filled 2020-07-09: qty 10

## 2020-07-09 MED ORDER — HEPARIN SODIUM (PORCINE) 1000 UNIT/ML IJ SOLN
INTRAMUSCULAR | Status: DC | PRN
  Administered 2020-07-09: 28000 [IU] via INTRAVENOUS

## 2020-07-09 MED ORDER — ASPIRIN 81 MG PO CHEW: 324.0000 mg | CHEWABLE_TABLET | Freq: Every day | ORAL | Status: DC

## 2020-07-09 MED ORDER — INSULIN REGULAR(HUMAN) IN NACL 100-0.9 UT/100ML-% IV SOLN: INTRAVENOUS | Status: DC

## 2020-07-09 MED ORDER — 0.9 % SODIUM CHLORIDE (POUR BTL) OPTIME
TOPICAL | Status: DC | PRN
  Administered 2020-07-09: 5000 mL

## 2020-07-09 MED ORDER — SODIUM CHLORIDE 0.9% FLUSH
3.0000 mL | INTRAVENOUS | Status: DC | PRN
  Administered 2020-07-10 (×2): 3 mL via INTRAVENOUS

## 2020-07-09 MED ORDER — LACTATED RINGERS IV SOLN: INTRAVENOUS | Status: DC

## 2020-07-09 MED ORDER — BISACODYL 5 MG PO TBEC
10.0000 mg | DELAYED_RELEASE_TABLET | Freq: Every day | ORAL | Status: DC
  Administered 2020-07-10 – 2020-07-13 (×3): 10 mg via ORAL
  Filled 2020-07-09 (×3): qty 2

## 2020-07-09 MED ORDER — DEXMEDETOMIDINE HCL IN NACL 400 MCG/100ML IV SOLN: 0.0000 ug/kg/h | INTRAVENOUS | Status: DC

## 2020-07-09 MED ORDER — DOCUSATE SODIUM 100 MG PO CAPS
200.0000 mg | ORAL_CAPSULE | Freq: Every day | ORAL | Status: DC
  Administered 2020-07-10 – 2020-07-13 (×3): 200 mg via ORAL
  Filled 2020-07-09 (×3): qty 2

## 2020-07-09 MED ORDER — SODIUM CHLORIDE 0.9 % IV SOLN: 250.0000 mL | INTRAVENOUS | Status: DC

## 2020-07-09 MED ORDER — NICARDIPINE HCL IN NACL 20-0.86 MG/200ML-% IV SOLN
0.0000 mg/h | INTRAVENOUS | Status: DC
  Administered 2020-07-09: 3 mg/h via INTRAVENOUS
  Administered 2020-07-09 – 2020-07-10 (×2): 2.5 mg/h via INTRAVENOUS
  Filled 2020-07-09 (×3): qty 200

## 2020-07-09 MED ORDER — VANCOMYCIN HCL IN DEXTROSE 1-5 GM/200ML-% IV SOLN
1000.0000 mg | Freq: Once | INTRAVENOUS | Status: AC
  Administered 2020-07-09: 1000 mg via INTRAVENOUS
  Filled 2020-07-09: qty 200

## 2020-07-09 MED ORDER — ACETAMINOPHEN 160 MG/5ML PO SOLN: 650.0000 mg | Freq: Once | ORAL | Status: AC

## 2020-07-09 MED ORDER — CHLORHEXIDINE GLUCONATE 0.12 % MT SOLN
15.0000 mL | Freq: Two times a day (BID) | OROMUCOSAL | Status: DC
  Administered 2020-07-09 – 2020-07-11 (×4): 15 mL via OROMUCOSAL
  Filled 2020-07-09 (×3): qty 15

## 2020-07-09 MED ORDER — ONDANSETRON HCL 4 MG/2ML IJ SOLN
INTRAMUSCULAR | Status: AC
  Filled 2020-07-09: qty 2

## 2020-07-09 MED ORDER — NICARDIPINE HCL IN NACL 20-0.86 MG/200ML-% IV SOLN
INTRAVENOUS | Status: DC | PRN
  Administered 2020-07-09: 2.5 mg/h via INTRAVENOUS

## 2020-07-09 MED ORDER — DOBUTAMINE IN D5W 4-5 MG/ML-% IV SOLN: 0.0000 ug/kg/min | INTRAVENOUS | Status: DC

## 2020-07-09 MED ORDER — OXYCODONE HCL 5 MG PO TABS
5.0000 mg | ORAL_TABLET | ORAL | Status: DC | PRN
  Administered 2020-07-09: 5 mg via ORAL
  Administered 2020-07-10 – 2020-07-12 (×9): 10 mg via ORAL
  Filled 2020-07-09 (×10): qty 2
  Filled 2020-07-09: qty 1

## 2020-07-09 MED ORDER — ORAL CARE MOUTH RINSE
15.0000 mL | Freq: Once | OROMUCOSAL | Status: AC
Start: 2020-07-09 — End: 2020-07-09

## 2020-07-09 MED ORDER — LIDOCAINE 2% (20 MG/ML) 5 ML SYRINGE
INTRAMUSCULAR | Status: DC | PRN
  Administered 2020-07-09: 100 mg via INTRAVENOUS

## 2020-07-09 MED ORDER — CHLORHEXIDINE GLUCONATE 0.12 % MT SOLN
15.0000 mL | OROMUCOSAL | Status: AC
  Administered 2020-07-09: 15 mL via OROMUCOSAL

## 2020-07-09 MED ORDER — METOPROLOL TARTRATE 12.5 MG HALF TABLET: 12.5000 mg | ORAL_TABLET | Freq: Once | ORAL | Status: DC

## 2020-07-09 MED ORDER — METOPROLOL TARTRATE 25 MG/10 ML ORAL SUSPENSION: 12.5000 mg | Freq: Two times a day (BID) | ORAL | Status: DC

## 2020-07-09 MED ORDER — LACTATED RINGERS IV SOLN
500.0000 mL | Freq: Once | INTRAVENOUS | Status: AC | PRN
  Administered 2020-07-10: 500 mL via INTRAVENOUS

## 2020-07-09 MED ORDER — METOPROLOL TARTRATE 5 MG/5ML IV SOLN
2.5000 mg | INTRAVENOUS | Status: DC | PRN
Start: 2020-07-09 — End: 2020-07-13
  Administered 2020-07-11: 2.5 mg via INTRAVENOUS
  Filled 2020-07-09: qty 5

## 2020-07-09 MED ORDER — PHENYLEPHRINE 40 MCG/ML (10ML) SYRINGE FOR IV PUSH (FOR BLOOD PRESSURE SUPPORT)
PREFILLED_SYRINGE | INTRAVENOUS | Status: AC
  Filled 2020-07-09: qty 10

## 2020-07-09 MED ORDER — POTASSIUM CHLORIDE 10 MEQ/50ML IV SOLN
10.0000 meq | INTRAVENOUS | Status: AC
  Administered 2020-07-09 (×3): 10 meq via INTRAVENOUS
  Filled 2020-07-09: qty 50

## 2020-07-09 MED ORDER — PHENYLEPHRINE 40 MCG/ML (10ML) SYRINGE FOR IV PUSH (FOR BLOOD PRESSURE SUPPORT)
PREFILLED_SYRINGE | INTRAVENOUS | Status: DC | PRN
  Administered 2020-07-09: 80 ug via INTRAVENOUS

## 2020-07-09 MED ORDER — METHOCARBAMOL 500 MG PO TABS
500.0000 mg | ORAL_TABLET | Freq: Three times a day (TID) | ORAL | Status: DC
  Administered 2020-07-09 – 2020-07-13 (×11): 500 mg via ORAL
  Filled 2020-07-09 (×11): qty 1

## 2020-07-09 MED ORDER — DEXAMETHASONE SODIUM PHOSPHATE 10 MG/ML IJ SOLN
INTRAMUSCULAR | Status: AC
  Filled 2020-07-09: qty 1

## 2020-07-09 MED ORDER — NICARDIPINE HCL IN NACL 20-0.86 MG/200ML-% IV SOLN
3.0000 mg/h | INTRAVENOUS | Status: DC
  Filled 2020-07-09: qty 200

## 2020-07-09 MED ORDER — POTASSIUM CHLORIDE 10 MEQ/50ML IV SOLN
10.0000 meq | INTRAVENOUS | Status: AC
  Administered 2020-07-09 (×3): 10 meq via INTRAVENOUS
  Filled 2020-07-09 (×2): qty 50

## 2020-07-09 MED ORDER — ORAL CARE MOUTH RINSE
15.0000 mL | Freq: Two times a day (BID) | OROMUCOSAL | Status: DC
  Administered 2020-07-10 (×2): 15 mL via OROMUCOSAL

## 2020-07-09 MED ORDER — ROCURONIUM BROMIDE 100 MG/10ML IV SOLN
INTRAVENOUS | Status: DC | PRN
  Administered 2020-07-09: 50 mg via INTRAVENOUS
  Administered 2020-07-09: 100 mg via INTRAVENOUS
  Administered 2020-07-09: 50 mg via INTRAVENOUS

## 2020-07-09 MED ORDER — SODIUM CHLORIDE 0.9 % IV SOLN
1.5000 g | Freq: Two times a day (BID) | INTRAVENOUS | Status: AC
  Administered 2020-07-09 – 2020-07-11 (×4): 1.5 g via INTRAVENOUS
  Filled 2020-07-09 (×4): qty 1.5

## 2020-07-09 MED ORDER — MORPHINE SULFATE (PF) 2 MG/ML IV SOLN
1.0000 mg | INTRAVENOUS | Status: DC | PRN
  Administered 2020-07-09: 2 mg via INTRAVENOUS
  Administered 2020-07-09: 4 mg via INTRAVENOUS
  Administered 2020-07-09 – 2020-07-11 (×3): 2 mg via INTRAVENOUS
  Filled 2020-07-09 (×2): qty 1
  Filled 2020-07-09: qty 2
  Filled 2020-07-09 (×2): qty 1

## 2020-07-09 MED ORDER — CHLORHEXIDINE GLUCONATE CLOTH 2 % EX PADS
6.0000 | MEDICATED_PAD | Freq: Every day | CUTANEOUS | Status: DC
  Administered 2020-07-09 – 2020-07-11 (×2): 6 via TOPICAL

## 2020-07-09 MED ORDER — SODIUM CHLORIDE (PF) 0.9 % IJ SOLN
INTRAMUSCULAR | Status: AC
  Filled 2020-07-09: qty 10

## 2020-07-09 MED ORDER — SODIUM CHLORIDE 0.9% FLUSH
3.0000 mL | Freq: Two times a day (BID) | INTRAVENOUS | Status: DC
  Administered 2020-07-10 – 2020-07-12 (×2): 3 mL via INTRAVENOUS

## 2020-07-09 MED ORDER — PROTAMINE SULFATE 10 MG/ML IV SOLN
INTRAVENOUS | Status: DC | PRN
  Administered 2020-07-09: 220 mg via INTRAVENOUS
  Administered 2020-07-09: 50 mg via INTRAVENOUS

## 2020-07-09 MED ORDER — ACETAMINOPHEN 500 MG PO TABS
1000.0000 mg | ORAL_TABLET | Freq: Four times a day (QID) | ORAL | Status: DC
  Administered 2020-07-10 – 2020-07-13 (×13): 1000 mg via ORAL
  Filled 2020-07-09 (×14): qty 2

## 2020-07-09 MED ORDER — MIDAZOLAM HCL 5 MG/5ML IJ SOLN
INTRAMUSCULAR | Status: DC | PRN
  Administered 2020-07-09: 5 mg via INTRAVENOUS
  Administered 2020-07-09: 3 mg via INTRAVENOUS
  Administered 2020-07-09: 2 mg via INTRAVENOUS

## 2020-07-09 MED ORDER — SODIUM CHLORIDE 0.9% FLUSH
10.0000 mL | INTRAVENOUS | Status: DC | PRN
  Administered 2020-07-10: 40 mL

## 2020-07-09 MED ORDER — SODIUM CHLORIDE 0.9% FLUSH
10.0000 mL | Freq: Two times a day (BID) | INTRAVENOUS | Status: DC
  Administered 2020-07-09: 10 mL
  Administered 2020-07-10: 20 mL
  Administered 2020-07-11 (×3): 10 mL

## 2020-07-09 MED ORDER — PANTOPRAZOLE SODIUM 40 MG PO TBEC
40.0000 mg | DELAYED_RELEASE_TABLET | Freq: Every day | ORAL | Status: DC
  Administered 2020-07-11 – 2020-07-13 (×3): 40 mg via ORAL
  Filled 2020-07-09 (×3): qty 1

## 2020-07-09 MED ORDER — MIDAZOLAM HCL 2 MG/2ML IJ SOLN: 2.0000 mg | INTRAMUSCULAR | Status: DC | PRN

## 2020-07-09 MED ORDER — ACETAMINOPHEN 650 MG RE SUPP
650.0000 mg | Freq: Once | RECTAL | Status: AC
  Administered 2020-07-09: 650 mg via RECTAL

## 2020-07-09 MED ORDER — CHLORHEXIDINE GLUCONATE 4 % EX LIQD: 30.0000 mL | CUTANEOUS | Status: DC

## 2020-07-09 MED ORDER — CHLORHEXIDINE GLUCONATE 0.12 % MT SOLN
15.0000 mL | Freq: Once | OROMUCOSAL | Status: DC
  Filled 2020-07-09: qty 15

## 2020-07-09 MED ORDER — FENTANYL CITRATE (PF) 250 MCG/5ML IJ SOLN
INTRAMUSCULAR | Status: AC
  Filled 2020-07-09: qty 25

## 2020-07-09 MED ORDER — ONDANSETRON HCL 4 MG/2ML IJ SOLN
4.0000 mg | Freq: Four times a day (QID) | INTRAMUSCULAR | Status: DC | PRN
  Filled 2020-07-09: qty 2

## 2020-07-09 MED ORDER — LIDOCAINE 2% (20 MG/ML) 5 ML SYRINGE
INTRAMUSCULAR | Status: AC
  Filled 2020-07-09: qty 5

## 2020-07-09 MED ORDER — SODIUM CHLORIDE 0.45 % IV SOLN: INTRAVENOUS | Status: DC | PRN

## 2020-07-09 MED ORDER — PROPOFOL 10 MG/ML IV BOLUS
INTRAVENOUS | Status: AC
  Filled 2020-07-09: qty 20

## 2020-07-09 MED ORDER — DEXTROSE 50 % IV SOLN: 0.0000 mL | INTRAVENOUS | Status: DC | PRN

## 2020-07-09 MED ORDER — MIDAZOLAM HCL (PF) 10 MG/2ML IJ SOLN
INTRAMUSCULAR | Status: AC
  Filled 2020-07-09: qty 2

## 2020-07-09 MED ORDER — FENTANYL CITRATE (PF) 250 MCG/5ML IJ SOLN
INTRAMUSCULAR | Status: DC | PRN
  Administered 2020-07-09: 50 ug via INTRAVENOUS
  Administered 2020-07-09 (×2): 100 ug via INTRAVENOUS
  Administered 2020-07-09: 200 ug via INTRAVENOUS
  Administered 2020-07-09: 100 ug via INTRAVENOUS
  Administered 2020-07-09 (×2): 150 ug via INTRAVENOUS

## 2020-07-09 MED ORDER — CHLORHEXIDINE GLUCONATE CLOTH 2 % EX PADS
6.0000 | MEDICATED_PAD | Freq: Every day | CUTANEOUS | Status: DC
  Administered 2020-07-09 – 2020-07-10 (×2): 6 via TOPICAL

## 2020-07-09 MED ORDER — ALBUMIN HUMAN 5 % IV SOLN
250.0000 mL | INTRAVENOUS | Status: AC | PRN
  Administered 2020-07-09 (×3): 12.5 g via INTRAVENOUS
  Filled 2020-07-09: qty 250

## 2020-07-09 MED ORDER — PLASMA-LYTE 148 IV SOLN
INTRAVENOUS | Status: DC | PRN
  Administered 2020-07-09: 500 mL

## 2020-07-09 SURGICAL SUPPLY — 100 items
APPLIER CLIP 9.375 SM OPEN (CLIP)
APR CLP SM 9.3 20 MLT OPN (CLIP)
BAG DECANTER FOR FLEXI CONT (MISCELLANEOUS) ×4 IMPLANT
BLADE CLIPPER SURG (BLADE) ×8 IMPLANT
BLADE STERNUM SYSTEM 6 (BLADE) ×4 IMPLANT
BLADE SURG 15 STRL LF DISP TIS (BLADE) ×3 IMPLANT
BLADE SURG 15 STRL SS (BLADE) ×8
BNDG ELASTIC 4X5.8 VLCR STR LF (GAUZE/BANDAGES/DRESSINGS) ×7 IMPLANT
BNDG ELASTIC 6X5.8 VLCR STR LF (GAUZE/BANDAGES/DRESSINGS) ×4 IMPLANT
BNDG GAUZE ELAST 4 BULKY (GAUZE/BANDAGES/DRESSINGS) ×4 IMPLANT
CABLE SURGICAL S-101-97-12 (CABLE) ×4 IMPLANT
CANISTER SUCT 3000ML PPV (MISCELLANEOUS) ×4 IMPLANT
CANNULA MC2 2 STG 29/37 NON-V (CANNULA) ×3 IMPLANT
CANNULA MC2 TWO STAGE (CANNULA) ×4
CANNULA NON VENT 20FR 12 (CANNULA) ×4 IMPLANT
CATH ROBINSON RED A/P 18FR (CATHETERS) ×8 IMPLANT
CLIP APPLIE 9.375 SM OPEN (CLIP) ×3 IMPLANT
CLIP RETRACTION 3.0MM CORONARY (MISCELLANEOUS) ×4 IMPLANT
CLIP VESOCCLUDE MED 24/CT (CLIP) IMPLANT
CLIP VESOCCLUDE SM WIDE 24/CT (CLIP) IMPLANT
CONN ST 1/2X1/2  BEN (MISCELLANEOUS) ×4
CONN ST 1/2X1/2 BEN (MISCELLANEOUS) ×3 IMPLANT
CONNECTOR BLAKE 2:1 CARIO BLK (MISCELLANEOUS) ×5 IMPLANT
COVER MAYO STAND STRL (DRAPES) ×3 IMPLANT
CUFF TOURN SGL QUICK 18X4 (TOURNIQUET CUFF) IMPLANT
CUFF TOURN SGL QUICK 24 (TOURNIQUET CUFF)
CUFF TRNQT CYL 24X4X16.5-23 (TOURNIQUET CUFF) IMPLANT
DRAIN CHANNEL 19F RND (DRAIN) ×11 IMPLANT
DRAIN CONNECTOR BLAKE 1:1 (MISCELLANEOUS) ×3 IMPLANT
DRAPE CARDIOVASCULAR INCISE (DRAPES) ×4
DRAPE EXTREMITY T 121X128X90 (DISPOSABLE) ×5 IMPLANT
DRAPE HALF SHEET 40X57 (DRAPES) ×5 IMPLANT
DRAPE INCISE IOBAN 66X45 STRL (DRAPES) IMPLANT
DRAPE SLUSH/WARMER DISC (DRAPES) ×4 IMPLANT
DRAPE SRG 135X102X78XABS (DRAPES) ×3 IMPLANT
DRSG AQUACEL AG ADV 3.5X10 (GAUZE/BANDAGES/DRESSINGS) ×4 IMPLANT
DRSG AQUACEL AG ADV 3.5X14 (GAUZE/BANDAGES/DRESSINGS) ×4 IMPLANT
DRSG COVADERM 4X14 (GAUZE/BANDAGES/DRESSINGS) ×4 IMPLANT
ELECT BLADE 4.0 EZ CLEAN MEGAD (MISCELLANEOUS) ×4
ELECT REM PT RETURN 9FT ADLT (ELECTROSURGICAL) ×8
ELECTRODE BLDE 4.0 EZ CLN MEGD (MISCELLANEOUS) ×3 IMPLANT
ELECTRODE REM PT RTRN 9FT ADLT (ELECTROSURGICAL) ×6 IMPLANT
FELT TEFLON 1X6 (MISCELLANEOUS) ×8 IMPLANT
GAUZE SPONGE 4X4 12PLY STRL (GAUZE/BANDAGES/DRESSINGS) ×9 IMPLANT
GAUZE SPONGE 4X4 12PLY STRL LF (GAUZE/BANDAGES/DRESSINGS) ×2 IMPLANT
GEL ULTRASOUND 20GR AQUASONIC (MISCELLANEOUS) ×4 IMPLANT
GLOVE BIO SURGEON STRL SZ7 (GLOVE) ×8 IMPLANT
GLOVE BIOGEL M STRL SZ7.5 (GLOVE) ×8 IMPLANT
GLOVE SURG MICRO LTX SZ6.5 (GLOVE) ×5 IMPLANT
GOWN STRL REUS W/ TWL LRG LVL3 (GOWN DISPOSABLE) ×12 IMPLANT
GOWN STRL REUS W/ TWL XL LVL3 (GOWN DISPOSABLE) ×6 IMPLANT
GOWN STRL REUS W/TWL LRG LVL3 (GOWN DISPOSABLE) ×40
GOWN STRL REUS W/TWL XL LVL3 (GOWN DISPOSABLE) ×8
HEMOSTAT POWDER SURGIFOAM 1G (HEMOSTASIS) ×12 IMPLANT
INSERT SUTURE HOLDER (MISCELLANEOUS) ×4 IMPLANT
KIT BASIN OR (CUSTOM PROCEDURE TRAY) ×4 IMPLANT
KIT SUCTION CATH 14FR (SUCTIONS) ×4 IMPLANT
KIT TURNOVER KIT B (KITS) ×8 IMPLANT
KIT VASOVIEW HEMOPRO 2 VH 4000 (KITS) ×3 IMPLANT
LEAD PACING MYOCARDI (MISCELLANEOUS) ×3 IMPLANT
MARKER GRAFT CORONARY BYPASS (MISCELLANEOUS) ×12 IMPLANT
NS IRRIG 1000ML POUR BTL (IV SOLUTION) ×20 IMPLANT
PACK ACCESSORY CANNULA KIT (KITS) ×4 IMPLANT
PACK E OPEN HEART (SUTURE) ×4 IMPLANT
PACK OPEN HEART (CUSTOM PROCEDURE TRAY) ×4 IMPLANT
PAD ARMBOARD 7.5X6 YLW CONV (MISCELLANEOUS) ×16 IMPLANT
PAD ELECT DEFIB RADIOL ZOLL (MISCELLANEOUS) ×4 IMPLANT
PENCIL BUTTON HOLSTER BLD 10FT (ELECTRODE) ×4 IMPLANT
POSITIONER HEAD DONUT 9IN (MISCELLANEOUS) ×4 IMPLANT
PUNCH AORTIC ROTATE 4.0MM (MISCELLANEOUS) ×4 IMPLANT
SET CARDIOPLEGIA MPS 5001102 (MISCELLANEOUS) ×1 IMPLANT
SHEARS HARMONIC 9CM CVD (BLADE) ×4 IMPLANT
SPONGE LAP 18X18 RF (DISPOSABLE) ×1 IMPLANT
SUPPORT HEART JANKE-BARRON (MISCELLANEOUS) ×4 IMPLANT
SUT BONE WAX W31G (SUTURE) ×4 IMPLANT
SUT ETHIBOND X763 2 0 SH 1 (SUTURE) ×8 IMPLANT
SUT MNCRL AB 3-0 PS2 18 (SUTURE) ×8 IMPLANT
SUT MNCRL AB 4-0 PS2 18 (SUTURE) ×1 IMPLANT
SUT PDS AB 1 CTX 36 (SUTURE) ×8 IMPLANT
SUT PROLENE 4 0 RB 1 (SUTURE) ×8
SUT PROLENE 4 0 SH DA (SUTURE) ×4 IMPLANT
SUT PROLENE 4-0 RB1 .5 CRCL 36 (SUTURE) IMPLANT
SUT PROLENE 5 0 C 1 36 (SUTURE) ×12 IMPLANT
SUT PROLENE 7 0 BV 1 (SUTURE) ×1 IMPLANT
SUT PROLENE 7 0 BV1 MDA (SUTURE) ×4 IMPLANT
SUT STEEL 6MS V (SUTURE) ×9 IMPLANT
SUT VIC AB 2-0 CT1 27 (SUTURE) ×4
SUT VIC AB 2-0 CT1 TAPERPNT 27 (SUTURE) IMPLANT
SUT VIC AB 3-0 SH 27 (SUTURE)
SUT VIC AB 3-0 SH 27X BRD (SUTURE) IMPLANT
SUT VIC AB 3-0 X1 27 (SUTURE) IMPLANT
SYR 50ML SLIP (SYRINGE) IMPLANT
SYSTEM SAHARA CHEST DRAIN ATS (WOUND CARE) ×4 IMPLANT
TAPE CLOTH SURG 4X10 WHT LF (GAUZE/BANDAGES/DRESSINGS) ×1 IMPLANT
TOWEL GREEN STERILE (TOWEL DISPOSABLE) ×8 IMPLANT
TOWEL GREEN STERILE FF (TOWEL DISPOSABLE) ×8 IMPLANT
TRAY FOLEY SLVR 16FR TEMP STAT (SET/KITS/TRAYS/PACK) ×4 IMPLANT
TUBING LAP HI FLOW INSUFFLATIO (TUBING) ×4 IMPLANT
UNDERPAD 30X36 HEAVY ABSORB (UNDERPADS AND DIAPERS) ×7 IMPLANT
WATER STERILE IRR 1000ML POUR (IV SOLUTION) ×8 IMPLANT

## 2020-07-09 NOTE — Anesthesia Postprocedure Evaluation (Signed)
Anesthesia Post Note  Patient: Brian Curry  Procedure(s) Performed: CORONARY ARTERY BYPASS GRAFTING (CABG) X TWO USING LEFT INTERNAL MAMMARY ARTERY AND LEFT RADIAL ARTERY (N/A Chest) RADIAL ARTERY HARVEST (Left Arm Lower) TRANSESOPHAGEAL ECHOCARDIOGRAM (TEE) (N/A )     Patient location during evaluation: PACU Anesthesia Type: General Level of consciousness: awake and alert Pain management: pain level controlled Vital Signs Assessment: post-procedure vital signs reviewed and stable Respiratory status: spontaneous breathing, nonlabored ventilation, respiratory function stable and patient connected to nasal cannula oxygen Cardiovascular status: blood pressure returned to baseline and stable Postop Assessment: no apparent nausea or vomiting Anesthetic complications: no   No complications documented.  Last Vitals:  Vitals:   07/09/20 1645 07/09/20 1700  BP: 114/66 123/69  Pulse: 92 90  Resp: (!) 26 (!) 28  Temp: 37.7 C 37.8 C  SpO2: 100% 100%    Last Pain:  Vitals:   07/09/20 0624  TempSrc: Oral  PainSc:                  Ava Tangney S

## 2020-07-09 NOTE — Op Note (Signed)
[address]       [place] [address]             [phone]                                          07/09/2020 Patient:  Brian Curry Pre-Op Dx: Left main coronary artery disease   Stable angina   Hypertension Post-op Dx: Same Procedure: CABG X 2, LIMA LAD, left radial artery OM1.  The radial artery was Y graft off of the LIMA. Open harvest of the left radial artery   Surgeon and Role:      * Berdia Lachman, Eliezer Lofts, MD - Primary    *T Judd Gaudier, PA-C- assisting Assistant: Webb Laws, PA-C  Anesthesia  general EBL: 250 ml Blood Administration: None Xclamp Time: 39 minutes Pump Time: 57 min  Drains: 19 F blake drain: L, mediastinal  Wires: None Counts: correct   Indications: 60 year old male with severe left main disease presented for an elective left heart cath.  On review of his echo his LV and RV function are preserved and he has no significant valvular disease.  We discussed the risks and benefits of a left radial harvest along with a two-vessel artery bypass grafting.  He is agreeable to proceed.  Findings: Good arterial conduit.  Good distal targets.  Operative Technique: All invasive lines were placed in pre-op holding.  After the risks, benefits and alternatives were thoroughly discussed, the patient was brought to the operative theatre.  Anesthesia was induced, and the patient was prepped and draped in normal sterile fashion.  An appropriate surgical pause was performed, and pre-operative antibiotics were dosed accordingly.  We began with simultaneous incisions along the left arm for harvesting of the radial artery and the chest for the sternotomy.  In regards to the sternotomy, this was carried down with bovie cautery, and the sternum was divided with a reciprocating saw.  Meticulous hemostasis was obtained.  The left internal thoracic artery was exposed and harvested in in pedicled fashion.  The patient was systemically heparinized, and the  artery was divided distally, and placed in a papaverine sponge.  Once the radial artery was harvested it was anastomosed to the midportion of the LIMA.  There was good flows through the radial artery and the LIMA at the completion of this anastomosis.  Both were placed and a papaverine sponge.  The sternal elevator was removed, and a retractor was placed.  The pericardium was divided in the midline and fashioned into a cradle with pericardial stitches.   After we confirmed an appropriate ACT, the ascending aorta was cannulated in standard fashion.  The right atrial appendage was used for venous cannulation site.  Cardiopulmonary bypass was initiated, and the heart retractor was placed. The cross clamp was applied, and a dose of anterograde cardioplegia was given with good arrest of the heart.  Next we exposed the lateral wall, and found a good target on the obtuse marginal.  An end to side anastomosis with the radial artery graft was then created.  Finally, we exposed a good target on the LAD, and fashioned an end to side anastomosis between it and the LITA.  We began to re-warm, and a re-animation dose of cardioplegia was given.  The heart was de-aired, and the cross clamp was removed.  Meticulous hemostasis was obtained.  We separated from cardiopulmonary bypass without event.the heparin was reversed with protamine.  Chest tubes and wires were placed, and the sternum was re-approximated with with sternal wires.  The soft tissue and skin were re-approximated wth absorbable suture.    The patient tolerated the procedure without any immediate complications, and was transferred to the ICU in guarded condition.  Kipling Graser Keane Scrape

## 2020-07-09 NOTE — Interval H&P Note (Signed)
History and Physical Interval Note:  07/09/2020 7:33 AM  Brian Curry  has presented today for surgery, with the diagnosis of CORONARY ARTERY DISEASE.  The various methods of treatment have been discussed with the patient and family. After consideration of risks, benefits and other options for treatment, the patient has consented to  Procedure(s): CORONARY ARTERY BYPASS GRAFTING (CABG) X USING LEFT INTERNAL MAMMAARY ARTERY AND LEFT RADIAL ARTERY (N/A) RADIAL ARTERY HARVEST (Left) TRANSESOPHAGEAL ECHOCARDIOGRAM (TEE) (N/A) as a surgical intervention.  The patient's history has been reviewed, patient examined, no change in status, stable for surgery.  I have reviewed the patient's chart and labs.  Questions were answered to the patient's satisfaction.     Brian Curry

## 2020-07-09 NOTE — Hospital Course (Addendum)
HPI:   60 year old male referred by Dr. Odis Hollingshead for surgical evaluation of severe left main disease.  He has been experiencing some chest pain over the last several months and underwent an elective left heart cath which identified his disease.  Prior to this he was fairly active but has gradually slowing himself down.The patient has severe left main disease presented for an elective left heart cath.  On review of his echo his LV and RV function are preserved and he has no significant valvular disease.  We discussed the risks and benefits of a left radial harvest along with a two-vessel artery bypass grafting.  He is agreeable to proceed.  He is scheduled for July 09, 2020.     Hospital Course:   Mr. Bolin underwent a CABG x 2 with radial artery conduit and left internal mammary with De. Lightfoot. He tolerated the procedure well and was transferred to the ICU in stable condition. He was extubated early the evening of surgery without difficulty. He was weaned off Cardene drip and started on Amlodipine for radial artery harvest. He was started on low dose Lopressor as well and this was titrated accordingly. He was volume overloaded and diuresed accordingly. He was weaned off Insulin drip. His pre op HGA1C 5.5. Accu checks and sliding scale PRN will be stopped after transfer. He had expected post op blood loss anemia. He did not require a post op transfusion. His last H and H was up to ***. CXR 04/14 showed possible free air. Clinically, patient had no abdominal pain, nausea, or vomiting. As discussed with Dr. Cliffton Asters, this CXR finding likely related to surgery/chest tube placement. He was felt surgically stable for transfer from the ICU to 4E for further convalescence on 04/14.

## 2020-07-09 NOTE — Transfer of Care (Signed)
Immediate Anesthesia Transfer of Care Note  Patient: Brian Curry  Procedure(s) Performed: CORONARY ARTERY BYPASS GRAFTING (CABG) X TWO USING LEFT INTERNAL MAMMARY ARTERY AND LEFT RADIAL ARTERY (N/A Chest) RADIAL ARTERY HARVEST (Left Arm Lower) TRANSESOPHAGEAL ECHOCARDIOGRAM (TEE) (N/A )  Patient Location: PACU and ICU  Anesthesia Type:General  Level of Consciousness: Patient remains intubated per anesthesia plan  Airway & Oxygen Therapy: Patient placed on Ventilator (see vital sign flow sheet for setting)  Post-op Assessment: Report given to RN and Post -op Vital signs reviewed and stable  Post vital signs: Reviewed and stable  Last Vitals:  Vitals Value Taken Time  BP 92/66 07/09/20 1243  Temp 35.5 C 07/09/20 1251  Pulse 73 07/09/20 1251  Resp 13 07/09/20 1251  SpO2 99 % 07/09/20 1251  Vitals shown include unvalidated device data.  Last Pain:  Vitals:   07/09/20 0624  TempSrc: Oral  PainSc:       Patients Stated Pain Goal: 5 (07/09/20 0612)  Complications: No complications documented.

## 2020-07-09 NOTE — Brief Op Note (Signed)
07/09/2020  1:15 PM  PATIENT:  Brian Curry  60 y.o. male  PRE-OPERATIVE DIAGNOSIS:  CORONARY ARTERY DISEASE  POST-OPERATIVE DIAGNOSIS:  CORONARY ARTERY DISEASE  PROCEDURE:  Procedure(s): CORONARY ARTERY BYPASS GRAFTING (CABG) X TWO USING LEFT INTERNAL MAMMARY ARTERY AND LEFT RADIAL ARTERY (N/A) RADIAL ARTERY HARVEST (Left) TRANSESOPHAGEAL ECHOCARDIOGRAM (TEE) (N/A)   Radial harvest: 50 minutes LIMA to LAD Radial T-graft off of LIMA to OM1   SURGEON:  Surgeon(s) and Role:    * Lightfoot, Eliezer Lofts, MD - Primary  PHYSICIAN ASSISTANT:  Gershon Crane, PA-C Jari Favre, PA-C  ANESTHESIA:   general  EBL:  529 mL   BLOOD ADMINISTERED:none  DRAINS: routine    LOCAL MEDICATIONS USED:  NONE  SPECIMEN:  No Specimen  DISPOSITION OF SPECIMEN:  N/A  COUNTS:  YES  TOURNIQUET:  * Missing tourniquet times found for documented tourniquets in log: [id number] *  DICTATION: .Dragon Dictation  PLAN OF CARE: Admit to inpatient   PATIENT DISPOSITION:  ICU - intubated and hemodynamically stable.   Delay start of Pharmacological VTE agent (>24hrs) due to surgical blood loss or risk of bleeding: yes

## 2020-07-09 NOTE — Anesthesia Procedure Notes (Signed)
Procedure Name: Intubation Date/Time: 07/09/2020 8:01 AM Performed by: Lynnell Chad, CRNA Pre-anesthesia Checklist: Patient identified, Emergency Drugs available, Suction available and Patient being monitored Patient Re-evaluated:Patient Re-evaluated prior to induction Oxygen Delivery Method: Circle System Utilized Preoxygenation: Pre-oxygenation with 100% oxygen Induction Type: IV induction Ventilation: Mask ventilation without difficulty Laryngoscope Size: Miller and 2 Grade View: Grade I Tube type: Oral Tube size: 8.0 mm Number of attempts: 1 Airway Equipment and Method: Stylet and Oral airway Placement Confirmation: ETT inserted through vocal cords under direct vision,  positive ETCO2 and breath sounds checked- equal and bilateral Secured at: 23 cm Tube secured with: Tape Dental Injury: Teeth and Oropharynx as per pre-operative assessment

## 2020-07-09 NOTE — Anesthesia Procedure Notes (Signed)
Anesthesia Procedure Image    

## 2020-07-09 NOTE — Procedures (Signed)
Extubation Procedure Note  Patient Details:   Name: Brian Curry DOB: 1960-07-07 MRN: [id number]   Airway Documentation:    Vent end date: 07/09/20 Vent end time: 1700   Evaluation  O2 sats: stable throughout Complications: No apparent complications Patient did tolerate procedure well. Bilateral Breath Sounds: Clear,Diminished   Yes   Pt was extubated per rapid wean protocol and placed on 3 L Rufus. Cuff leak was noted prior to extubation and no stridor post. Nif was -26 and VC 1.5 L. Pt is stable at this time. Rt will monitor.   Merlene Laughter 07/09/2020, 5:12 PM

## 2020-07-09 NOTE — OR Nursing (Signed)
2nd call SICU 1150

## 2020-07-09 NOTE — Progress Notes (Signed)
Patient ID: Brian Curry, male   DOB: Dec 25, 1960, 60 y.o.   MRN: [id number]  TCTS Evening Rounds:   Hemodynamically stable  CI = 3.5  Nicardipine drip for radial graft.  Extubated and awake  Urine output good  CT output low  CBC    Component Value Date/Time   WBC 6.1 07/09/2020 1228   RBC 4.29 07/09/2020 1228   HGB 11.2 (L) 07/09/2020 1811   HGB 16.1 03/27/2013 0104   HCT 33.0 (L) 07/09/2020 1811   HCT 47.0 03/27/2013 0104   PLT 110 (L) 07/09/2020 1228   PLT 175 03/27/2013 0104   MCV 83.0 07/09/2020 1228   MCV 82 03/27/2013 0104   MCH 28.9 07/09/2020 1228   MCHC 34.8 07/09/2020 1228   RDW 11.9 07/09/2020 1228   RDW 13.2 03/27/2013 0104   LYMPHSABS 1.0 03/27/2013 0104   MONOABS 0.4 03/27/2013 0104   EOSABS 0.1 03/27/2013 0104   BASOSABS 0.0 03/27/2013 0104     BMET    Component Value Date/Time   NA 141 07/09/2020 1811   NA 138 06/28/2020 1001   NA 138 03/27/2013 0104   K 3.6 07/09/2020 1811   K 3.9 03/27/2013 0104   CL 103 07/09/2020 1133   CL 105 03/27/2013 0104   CO2 22 07/05/2020 1228   CO2 29 03/27/2013 0104   GLUCOSE 123 (H) 07/09/2020 1133   GLUCOSE 125 (H) 03/27/2013 0104   BUN 10 07/09/2020 1133   BUN 16 06/28/2020 1001   BUN 13 03/27/2013 0104   CREATININE 0.90 07/09/2020 1133   CREATININE 1.24 03/27/2013 0104   CALCIUM 9.2 07/05/2020 1228   CALCIUM 9.1 03/27/2013 0104   GFRNONAA >60 07/05/2020 1228   GFRNONAA >60 03/27/2013 0104   GFRAA >60 03/27/2013 0104     A/P:  Stable postop course. Continue current plans

## 2020-07-09 NOTE — Progress Notes (Signed)
  Echocardiogram Echocardiogram Transesophageal has been performed.  Brian Curry 07/09/2020, 8:55 AM

## 2020-07-09 NOTE — Anesthesia Procedure Notes (Signed)
Arterial Line Insertion Start/End4/02/2021 7:15 AM Performed by: Eilene Ghazi, MD, Lynnell Chad, CRNA, CRNA  Preanesthetic checklist: patient identified Lidocaine 1% used for infiltration and patient sedated radial was placed Catheter size: 20 G Hand hygiene performed   Attempts: 1 Procedure performed without using ultrasound guided technique. Following insertion, Biopatch and dressing applied. Post procedure assessment: unchanged  Patient tolerated the procedure well with no immediate complications.

## 2020-07-09 NOTE — Anesthesia Procedure Notes (Addendum)
Central Venous Catheter Insertion Performed by: Eilene Ghazi, MD, anesthesiologist Start/End4/02/2021 6:45 AM, 07/09/2020 7:08 AM Patient location: Pre-op. Preanesthetic checklist: patient identified, IV checked, site marked, risks and benefits discussed, surgical consent, monitors and equipment checked, pre-op evaluation, timeout performed and anesthesia consent Position: Trendelenburg Lidocaine 1% used for infiltration and patient sedated Hand hygiene performed  and maximum sterile barriers used  Catheter size: 8.5 Fr PA cath was placed.Sheath introducer Swan type:thermodilution Procedure performed without using ultrasound guided technique. Ultrasound Notes:anatomy identified, needle tip was noted to be adjacent to the nerve/plexus identified, no ultrasound evidence of intravascular and/or intraneural injection and image(s) printed for medical record Attempts: 1 Following insertion, line sutured, dressing applied and Biopatch. Post procedure assessment: blood return through all ports, free fluid flow and no air  Patient tolerated the procedure well with no immediate complications.

## 2020-07-09 NOTE — Anesthesia Preprocedure Evaluation (Addendum)
Anesthesia Evaluation  Patient identified by MRN, date of birth, ID band Patient awake    Reviewed: Allergy & Precautions, NPO status , Patient's Chart, lab work & pertinent test results  Airway Mallampati: II  TM Distance: >3 FB Neck ROM: Full    Dental no notable dental hx.    Pulmonary neg pulmonary ROS,    Pulmonary exam normal breath sounds clear to auscultation       Cardiovascular hypertension, + angina + CAD  Normal cardiovascular exam Rhythm:Regular Rate:Normal  Ef 60-65%. No valvular abnormalities   Neuro/Psych negative neurological ROS  negative psych ROS   GI/Hepatic negative GI ROS, Neg liver ROS,   Endo/Other  negative endocrine ROS  Renal/GU negative Renal ROS  negative genitourinary   Musculoskeletal negative musculoskeletal ROS (+)   Abdominal   Peds negative pediatric ROS (+)  Hematology negative hematology ROS (+)   Anesthesia Other Findings   Reproductive/Obstetrics negative OB ROS                            Anesthesia Physical Anesthesia Plan  ASA: III  Anesthesia Plan: General   Post-op Pain Management:    Induction: Intravenous  PONV Risk Score and Plan: 2 and Ondansetron, Dexamethasone and Treatment may vary due to age or medical condition  Airway Management Planned: Oral ETT  Additional Equipment: Arterial line, CVP, PA Cath, TEE and Ultrasound Guidance Line Placement  Intra-op Plan:   Post-operative Plan: Post-operative intubation/ventilation  Informed Consent: I have reviewed the patients History and Physical, chart, labs and discussed the procedure including the risks, benefits and alternatives for the proposed anesthesia with the patient or authorized representative who has indicated his/her understanding and acceptance.     Dental advisory given  Plan Discussed with: CRNA and Surgeon  Anesthesia Plan Comments:         Anesthesia  Quick Evaluation

## 2020-07-10 ENCOUNTER — Encounter (HOSPITAL_COMMUNITY): Payer: Self-pay | Admitting: Thoracic Surgery (Cardiothoracic Vascular Surgery)

## 2020-07-10 ENCOUNTER — Inpatient Hospital Stay (HOSPITAL_COMMUNITY): Payer: Federal, State, Local not specified - PPO

## 2020-07-10 DIAGNOSIS — I517 Cardiomegaly: Secondary | ICD-10-CM | POA: Diagnosis not present

## 2020-07-10 DIAGNOSIS — J9811 Atelectasis: Secondary | ICD-10-CM | POA: Diagnosis not present

## 2020-07-10 DIAGNOSIS — R079 Chest pain, unspecified: Secondary | ICD-10-CM | POA: Diagnosis not present

## 2020-07-10 LAB — CBC
HCT: 32.8 % — ABNORMAL LOW (ref 39.0–52.0)
HCT: 36.1 % — ABNORMAL LOW (ref 39.0–52.0)
HCT: 35.9 % — ABNORMAL LOW (ref 39.0–52.0)
Hemoglobin: 11.4 g/dL — ABNORMAL LOW (ref 13.0–17.0)
Hemoglobin: 12.4 g/dL — ABNORMAL LOW (ref 13.0–17.0)
Hemoglobin: 12.4 g/dL — ABNORMAL LOW (ref 13.0–17.0)
MCH: 28.9 pg (ref 26.0–34.0)
MCH: 28.3 pg (ref 26.0–34.0)
MCH: 29 pg (ref 26.0–34.0)
MCHC: 34.8 g/dL (ref 30.0–36.0)
MCHC: 34.3 g/dL (ref 30.0–36.0)
MCHC: 34.5 g/dL (ref 30.0–36.0)
MCV: 83 fL (ref 80.0–100.0)
MCV: 82.4 fL (ref 80.0–100.0)
MCV: 83.9 fL (ref 80.0–100.0)
Platelets: 135 10*3/uL — ABNORMAL LOW (ref 150–400)
Platelets: 149 10*3/uL — ABNORMAL LOW (ref 150–400)
Platelets: 155 10*3/uL (ref 150–400)
RBC: 3.95 MIL/uL — ABNORMAL LOW (ref 4.22–5.81)
RBC: 4.38 MIL/uL (ref 4.22–5.81)
RBC: 4.28 MIL/uL (ref 4.22–5.81)
RDW: 12.4 % (ref 11.5–15.5)
RDW: 12.5 % (ref 11.5–15.5)
RDW: 12.7 % (ref 11.5–15.5)
WBC: 11.2 10*3/uL — ABNORMAL HIGH (ref 4.0–10.5)
WBC: 13.6 10*3/uL — ABNORMAL HIGH (ref 4.0–10.5)
WBC: 14.2 10*3/uL — ABNORMAL HIGH (ref 4.0–10.5)
nRBC: 0 % (ref 0.0–0.2)
nRBC: 0 % (ref 0.0–0.2)
nRBC: 0 % (ref 0.0–0.2)

## 2020-07-10 LAB — GLUCOSE, CAPILLARY
Glucose-Capillary: 126 mg/dL — ABNORMAL HIGH (ref 70–99)
Glucose-Capillary: 128 mg/dL — ABNORMAL HIGH (ref 70–99)
Glucose-Capillary: 133 mg/dL — ABNORMAL HIGH (ref 70–99)
Glucose-Capillary: 137 mg/dL — ABNORMAL HIGH (ref 70–99)
Glucose-Capillary: 138 mg/dL — ABNORMAL HIGH (ref 70–99)
Glucose-Capillary: 139 mg/dL — ABNORMAL HIGH (ref 70–99)
Glucose-Capillary: 140 mg/dL — ABNORMAL HIGH (ref 70–99)
Glucose-Capillary: 145 mg/dL — ABNORMAL HIGH (ref 70–99)
Glucose-Capillary: 146 mg/dL — ABNORMAL HIGH (ref 70–99)
Glucose-Capillary: 147 mg/dL — ABNORMAL HIGH (ref 70–99)
Glucose-Capillary: 150 mg/dL — ABNORMAL HIGH (ref 70–99)
Glucose-Capillary: 152 mg/dL — ABNORMAL HIGH (ref 70–99)

## 2020-07-10 LAB — CREATININE, SERUM
Creatinine, Ser: 1.22 mg/dL (ref 0.61–1.24)
GFR, Estimated: >60 mL/min (ref >60)

## 2020-07-10 LAB — BASIC METABOLIC PANEL
Anion gap: 5 (ref 5–15)
Anion gap: 7 (ref 5–15)
BUN: 12 mg/dL (ref 8–23)
BUN: 13 mg/dL (ref 8–23)
CO2: 22 mmol/L (ref 22–32)
CO2: 24 mmol/L (ref 22–32)
Calcium: 7.8 mg/dL — ABNORMAL LOW (ref 8.9–10.3)
Calcium: 8.3 mg/dL — ABNORMAL LOW (ref 8.9–10.3)
Chloride: 107 mmol/L (ref 98–111)
Chloride: 100 mmol/L (ref 98–111)
Creatinine, Ser: 1.23 mg/dL (ref 0.61–1.24)
Creatinine, Ser: 1.2 mg/dL (ref 0.61–1.24)
GFR, Estimated: >60 mL/min (ref >60)
GFR, Estimated: >60 mL/min (ref >60)
Glucose, Bld: 131 mg/dL — ABNORMAL HIGH (ref 70–99)
Glucose, Bld: 152 mg/dL — ABNORMAL HIGH (ref 70–99)
Potassium: 4.1 mmol/L (ref 3.5–5.1)
Potassium: 4 mmol/L (ref 3.5–5.1)
Sodium: 134 mmol/L — ABNORMAL LOW (ref 135–145)
Sodium: 131 mmol/L — ABNORMAL LOW (ref 135–145)

## 2020-07-10 LAB — MAGNESIUM
Magnesium: 2.3 mg/dL (ref 1.7–2.4)
Magnesium: 2.3 mg/dL (ref 1.7–2.4)

## 2020-07-10 MED ORDER — METOCLOPRAMIDE HCL 5 MG/ML IJ SOLN
10.0000 mg | Freq: Four times a day (QID) | INTRAMUSCULAR | Status: AC
  Administered 2020-07-10 – 2020-07-11 (×5): 10 mg via INTRAVENOUS
  Filled 2020-07-10 (×5): qty 2

## 2020-07-10 MED ORDER — ENOXAPARIN SODIUM 40 MG/0.4ML ~~LOC~~ SOLN
40.0000 mg | Freq: Every day | SUBCUTANEOUS | Status: DC
  Administered 2020-07-10 – 2020-07-12 (×3): 40 mg via SUBCUTANEOUS
  Filled 2020-07-10 (×3): qty 0.4

## 2020-07-10 MED ORDER — LIDOCAINE 5 % EX PTCH
2.0000 | MEDICATED_PATCH | CUTANEOUS | Status: DC
  Administered 2020-07-10 – 2020-07-12 (×3): 2 via TRANSDERMAL
  Filled 2020-07-10 (×4): qty 2

## 2020-07-10 MED ORDER — INSULIN ASPART 100 UNIT/ML ~~LOC~~ SOLN
0.0000 [IU] | SUBCUTANEOUS | Status: DC
  Administered 2020-07-10 – 2020-07-11 (×6): 2 [IU] via SUBCUTANEOUS
  Administered 2020-07-11: 4 [IU] via SUBCUTANEOUS
  Administered 2020-07-12: 2 [IU] via SUBCUTANEOUS

## 2020-07-10 MED ORDER — AMLODIPINE BESYLATE 5 MG PO TABS
5.0000 mg | ORAL_TABLET | Freq: Every day | ORAL | Status: DC
  Administered 2020-07-10 – 2020-07-13 (×4): 5 mg via ORAL
  Filled 2020-07-10 (×4): qty 1

## 2020-07-10 NOTE — Discharge Summary (Signed)
Physician Discharge Summary       [address] [place].[address]       [place] [address]             [phone]    Patient ID: ANDRUW BATTIE MRN: [id number] DOB/AGE: Nov 02, 1960 60 y.o.  Admit date: 07/09/2020 Discharge date: 07/13/2020  Admission Diagnoses:  Discharge Diagnoses:  1. S/P CABG x 2 2. Expected post op blood loss anemia 3. History of hyperlipidemia 4. History of hypertension 5. History of BPH (benign prostatic hyperplasia)  Consults: None  Procedure (s):  CABG X 2, LIMA LAD, left radial artery OM1.  The radial artery was Y graft off of the LIMA. Open harvest of the left radial artery by Dr. Cliffton Asters on 07/09/2020.  History of Presenting Illness: This is a 60 year old male referred by Dr. Odis Hollingshead for surgical evaluation of severe left main disease.  He has been experiencing some chest pain over the last several months and underwent an elective left heart cath which identified his disease.  Prior to this he was fairly active but has gradually slowing himself down.The patient has severe left main disease presented for an elective left heart cath.  On review of his echo his LV and RV function are preserved and he has no significant valvular disease.  We discussed the risks and benefits of a left radial harvest along with a two-vessel artery bypass grafting.  He is agreeable to proceed.  He underwent a CABG x 2 by Dr. Cliffton Asters on 07/09/2020.  Brief Hospital Course:  Mr. Daniello underwent a CABG x 2 with radial artery conduit and left internal mammary with De. Lightfoot. He tolerated the procedure well and was transferred to the ICU in stable condition. He was extubated early the evening of surgery without difficulty. He was weaned off Cardene drip and started on Amlodipine for radial artery harvest. He was started on low dose Lopressor as well and this was titrated accordingly. He was volume overloaded and diuresed accordingly. He was weaned off Insulin drip. His pre op  HGA1C 5.5. Accu checks and sliding scale PRN will be stopped after transfer. He had expected post op blood loss anemia. He did not require a post op transfusion. His last H and H was up to 12.1 and 34.4. CXR 04/14 showed possible free air. Clinically, patient had no abdominal pain, nausea, or vomiting. As discussed with Dr. Cliffton Asters, this CXR finding likely related to surgery/chest tube placement. He was felt surgically stable for transfer from the ICU to 4E for further convalescence on 04/14. He had slight tachycardia on 04/15 so Lopresor was increased to 37.5 mg bid. He did have complaints of a lot of gas. He was tolerating a diet and given a laxative to assist with bowel movement. His sternal and LUE wounds are clean, dry, and healing without signs of infection. He is ambulating on room air. He is felt surgically stable for discharge today.  Latest Vital Signs: Blood pressure 124/70, pulse 83, temperature 98.8 F (37.1 C), temperature source Oral, resp. rate 19, height  (1.702 m), weight 91 kg, SpO2 96 %.  Physical Exam  General appearance: alert, cooperative and no distress Heart: regular rate and rhythm, S1, S2 normal, no murmur, click, rub or gallop Lungs: clear to auscultation bilaterally Abdomen: soft, non-tender; bowel sounds normal; no masses,  no organomegaly Extremities: some edema in left arm (radial harvest) Wound: clean and dry  Discharge Condition: Stable and discharged to home.  Recent laboratory studies:  Lab Results  Component Value Date   WBC 11.4 (H) 07/12/2020   HGB 12.1 (L) 07/12/2020   HCT 34.4 (L) 07/12/2020   MCV 82.1 07/12/2020   PLT 164 07/12/2020   Lab Results  Component Value Date   NA 132 (L) 07/12/2020   K 3.4 (L) 07/12/2020   CL 100 07/12/2020   CO2 26 07/12/2020   CREATININE 1.18 07/12/2020   GLUCOSE 132 (H) 07/12/2020      Diagnostic Studies: DG Chest 2 View  Result Date: 07/07/2020 CLINICAL DATA:  Preoperative study prior to surgery.  EXAM: CHEST - 2 VIEW COMPARISON:  None. FINDINGS: The heart size and mediastinal contours are within normal limits. Both lungs are clear. The visualized skeletal structures are unremarkable. IMPRESSION: No active cardiopulmonary disease. Electronically Signed   By: Gerome Sam III M.D   On: 07/07/2020 16:32   CARDIAC CATHETERIZATION  Result Date: 07/02/2020 LM: Distal 90% stenosis LAD: Ostial-prox 95-90% tandem stenoses LCx: Prox LCx 50% stenosis RCA: Mid 20% stenosis Excellent surgical targets in LAD/LCx LVEDP normal Patient has had chronic stable angina, currently asymptomatic and HD stable. I have consulted CVTS for urgent outpatient consultation. Cautioned the patient to avoid any strenuous physical activity. Elder Negus, MD Pager: [phone] Office: [phone]  CT CORONARY MORPH W/CTA COR W/SCORE W/CA W/CM &/OR WO/CM  Addendum Date: 06/26/2020   ADDENDUM REPORT: 06/26/2020 17:25 HISTORY: Chest pain/anginal equiv, ECGs and troponins normal EXAM: Cardiac/Coronary  CT TECHNIQUE: The patient was scanned on a [employer]. PROTOCOL: A 120 kV prospective scan was triggered in the descending thoracic aorta at 111 HU's. Axial non-contrast 3 mm slices were carried out through the heart. The data set was analyzed on a dedicated work station and scored using the Agatson method. Gantry rotation speed was 250 msecs and collimation was .6 mm. No IV beta blockade but 0.8 mg of sl NTG was given. The 3D data set was reconstructed in 5% intervals of the 67-82 % of the R-R cycle. Diastolic phases were analyzed on a dedicated work station using MPR, MIP and VRT modes. The patient received 80mL OMNIPAQUE IOHEXOL 350 MG/ML SOLN of contrast. FINDINGS: Image quality: Average. Noise artifact is: Limited. Coronary artery calcification score: Left main: 38.9 Left anterior descending artery: 193 Left circumflex artery: 165 Right coronary artery: 187 Total calcium score: 584 AU Total coronary calcium score  of 584, places the patient at the 97th percentile for age and sex matched control. Coronary arteries: Normal coronary origins.  Right dominance. Left Main Coronary Artery: The left main is a normal caliber vessel with a normal take off from the left coronary cusp that bifurcates to form a left anterior descending artery and a left circumflex artery. Mild stenosis (25-49%) at the distal left main due to mixed plaque. Left Anterior Descending Coronary Artery: Normal caliber vessel that wraps the apex and gives rise to two diagonal branches. Moderate stenosis (50-69%) at the ostial/proximal LAD due to mixed plaque. Mid to distal LAD overall patent with minimal calcified plaque. Both diagonal branches are overall patent. Left Circumflex Artery: LCX is non-dominant vessel travels within the atrioventricular groove and gives rise to one large obtuse marginal branch. Mild stenosis (25-49%) in the proximal LCx due to calcified plaque. Mid to LCx segments are small in caliber and travels within the AV groove. OM 1 is large caliber vessel with superior and inferior branches. Mild stenosis (25-49%) at the ostial OM1 due to mixed plaque, remainder of the vessel is patent with mild  diffuse calcified plaque. Right Coronary Artery: The RCA is dominant with normal take off from the right coronary cusp. The RCA terminates as a PDA and right posterolateral branch. Mild stenosis (25-49%) in mid RCA due to calcified plaque. Otherwise no significant evidence of plaque or stenosis. Left Atrium: Grossly normal in size with no left atrial appendage filling defect. Left Ventricle: Grossly normal in size. There are no stigmata of prior infarction. There is no abnormal filling defect. Pulmonary arteries: Normal in size without proximal filling defect. Pulmonary veins: Normal pulmonary venous drainage. Aorta: Normal size, 32 mm at the mid ascending aorta (level of the PA bifurcation) measured double oblique. Aortic atherosclerosis. No  dissection. Pericardium: Normal thickness with no significant effusion or calcium present. Cardiac valves: The aortic valve is trileaflet with minimal leaflet calcification. The mitral valve is normal structure without calcification. Extra-cardiac findings: See attached radiology report for non-cardiac structures. IMPRESSION: 1. Coronary calcium score of 584. This was 97th percentile for age and sex matched control. 2. Normal coronary origin with right dominance. 3. CAD-RADS = 3. Mild stenosis (25-49%) at the distal left main due to mixed plaque. Moderate stenosis (50-69%) at the ostial/proximal LAD due to mixed plaque. Mild stenosis (25-49%) in the proximal LCx due to calcified plaque. Mild stenosis (25-49%) at the ostial OM1 due to mixed plaque. Mild stenosis (25-49%) in mid RCA due to calcified plaque. 4. Aortic atherosclerosis. 5. Study is sent for CT-FFR to further evaluate the LM and LAD disease. Findings will be performed and reported separately. RECOMMENDATIONS: Consider symptom-guided anti-ischemic pharmacotherapy as well as risk factor modification per guideline directed care. Additional analysis with CT FFR will be submitted. Electronically Signed   By: Edin Kon Lerner   On: 06/26/2020 17:25   Result Date: 06/26/2020 EXAM: OVER-READ INTERPRETATION  CT CHEST The following report is an over-read performed by radiologist Dr. Jeronimo Greaves of [facility] on 06/25/2020. This over-read does not include interpretation of cardiac or coronary anatomy or pathology. The coronary CTA interpretation by the cardiologist is attached. COMPARISON:  None. FINDINGS: Vascular: Aortic atherosclerosis. No central pulmonary embolism, on this non-dedicated study. Mediastinum/Nodes: No imaged thoracic adenopathy. Lungs/Pleura: No pleural fluid.  Anterior right lung base scarring. Upper Abdomen: Normal imaged portions of the liver, spleen, stomach. Musculoskeletal: No acute osseous abnormality. IMPRESSION: 1. No acute  findings in the imaged extracardiac chest. 2. Aortic Atherosclerosis (ICD10-I70.0). Electronically Signed: By: Jeronimo Greaves M.D. On: 06/25/2020 11:02   DG Chest Port 1 View  Result Date: 07/11/2020 CLINICAL DATA:  Respiratory distress EXAM: PORTABLE CHEST 1 VIEW COMPARISON:  07/10/2020 FINDINGS: Left chest tube and mediastinal drain have been removed. No pneumothorax. Interval development of lucency below the right hemidiaphragm and extending across midline into the epigastrium. While this may simply represent gas within the transverse colon, free intraperitoneal gas below the hemidiaphragm is not excluded. Lung volumes are small, however, pulmonary insufflation is stable when compared to prior examination. Right internal jugular central venous catheter is unchanged. Median sternotomy has been performed. Cardiac size is mildly enlarged, unchanged. Pulmonary vascularity is normal. IMPRESSION: Interval mediastinal drain removal. Possible free intraperitoneal gas. This could be confirmed with upright or decubitus abdominal radiographs. Interval left chest tube removal.  No pneumothorax. Stable pulmonary hypoinflation. These results will be called to the ordering clinician or representative by the Radiologist Assistant, and communication documented in the PACS or [employer]. Electronically Signed   By: Helyn Numbers MD   On: 07/11/2020 06:39   DG Chest Port 1  View  Result Date: 07/10/2020 CLINICAL DATA:  Chest pain.  Status postextubation EXAM: PORTABLE CHEST 1 VIEW COMPARISON:  July 09, 2020 FINDINGS: Endotracheal tube and nasogastric tube have been removed. Central catheter tip is in the superior vena cava. Mediastinal drain and left chest tube again noted. No pneumothorax. There is left lower lobe atelectatic change. There is mild consolidation medial aspect of each lung base. There is stable cardiac enlargement with pulmonary vascularity normal. No evident adenopathy. No bone lesions. IMPRESSION:  Tube and catheter positions as described without evident pneumothorax. Medial airspace opacity likely due to atelectasis with potential superimposed pneumonia in the medial lung bases noted. There is also left lower lobe region atelectasis. Stable cardiomegaly. Electronically Signed   By: Bretta Bang III M.D.   On: 07/10/2020 08:18   DG Chest Port 1 View  Result Date: 07/09/2020 CLINICAL DATA:  Status post CABG EXAM: PORTABLE CHEST 1 VIEW COMPARISON:  07/05/2020 FINDINGS: Endotracheal tube with the tip 4.5 cm above the carina. Nasogastric tube coursing below the diaphragm. Right jugular central venous catheter with the tip projecting over the SVC. Left-sided chest tube. Mediastinal drain. No focal consolidation. No pleural effusion or pneumothorax. Heart and mediastinal contours are unremarkable. Interval CABG. No acute osseous abnormality. IMPRESSION: 1. Support lines and tubing in satisfactory position. 2. No acute cardiopulmonary disease. Electronically Signed   By: Elige Ko   On: 07/09/2020 13:27   CT CORONARY FRACTIONAL FLOW RESERVE DATA PREP  Result Date: 06/27/2020 EXAM: CT FFR ANALYSIS CLINICAL DATA:  Chest pain/anginal equiv, ECGs and troponins normal FINDINGS: FFRct analysis was performed on the original cardiac CT angiogram dataset. Diagrammatic representation of the FFRct analysis is provided in a separate PDF document in PACS. This dictation was created using the PDF document and an interactive 3D model of the results. 3D model is not available in the EMR/PACS. Normal FFR range is >0.80. Indeterminate (grey) zone is 0.76-0.80. 1. Left Main: Proximal FFR = 1.0, Distal FFR = 0.72 2. LAD: Proximal FFR = 0.58, mid FFR = 0.55, distal FFR = <0.50 3. LCX: Proximal FFR = 0.72, distal FFR = not modeled due to small caliber vessel. 4. OM 1: Proximal FFR = 0.64, Distal FFR = 0.59 5. RCA: Proximal FFR = 0.98, mid FFR =0.90, distal FFR = 0.81 IMPRESSION: 1. CT FFR analysis illustrated  hemodynamically significant stenosis at the distal left main, ostial-proximal LAD and LCX, proximal OM1 branch. RECOMMENDATIONS: Invasive angiography recommended along with goal directed medical therapy and aggressive risk factor modification for secondary prevention of coronary artery disease. Electronically Signed   By: Evamarie Raetz Lerner   On: 06/27/2020 23:40   ECHO INTRAOPERATIVE TEE  Result Date: 07/09/2020  *INTRAOPERATIVE TRANSESOPHAGEAL REPORT *  Patient Name:   ARCHIBALD MARCHETTA Date of Exam: 07/09/2020 Medical Rec #:  [id number]            Height:       67.0 in Accession #:    [id number]           Weight:       200.0 lb Date of Birth:  01/12/1961            BSA:          2.02 m Patient Age:    59 years             BP:           152/74 mmHg Patient Gender: M  HR:           66 bpm. Exam Location:  Inpatient Transesophogeal exam was perform intraoperatively during surgical procedure. Patient was closely monitored under general anesthesia during the entirety of examination. Indications:     R07.9* Chest pain, unspecified; I25.110 Atherosclerotic heart                  disease of native coronary artery with unstable angina pectoris Sonographer:     Sheralyn Boatman Performing Phys: [id number] Eliezer Lofts LIGHTFOOT Diagnosing Phys: Eilene Ghazi MD Complications: No known complications during this procedure. POST-OP IMPRESSIONS - Left Ventricle: The left ventricle is unchanged from pre-bypass. - Right Ventricle: The right ventricle appears unchanged from pre-bypass. - Aorta: The aorta appears unchanged from pre-bypass. - Left Atrium: The left atrium appears unchanged from pre-bypass. - Left Atrial Appendage: The left atrial appendage appears unchanged from pre-bypass. - Aortic Valve: The aortic valve appears unchanged from pre-bypass. - Mitral Valve: The mitral valve appears unchanged from pre-bypass. - Tricuspid Valve: The tricuspid valve appears unchanged from pre-bypass. - Pulmonic Valve: The pulmonic  valve appears unchanged from pre-bypass. - Interatrial Septum: The interatrial septum appears unchanged from pre-bypass. - Interventricular Septum: The interventricular septum appears unchanged from pre-bypass. - Pericardium: The pericardium appears unchanged from pre-bypass. - Comments: EF maintained at 60-65%, No RWMA, pericardial effusion unchanged. PRE-OP FINDINGS  Left Ventricle: The left ventricle has normal systolic function, with an ejection fraction of 60-65%. The cavity size was normal. There is no increase in left ventricular wall thickness. There is the interventricular septum is flattened in systole, consistent with right ventricular pressure overload. There is moderate concentric left ventricular hypertrophy. Left ventricular diastolic parameters were normal. Right Ventricle: The right ventricle has normal systolic function. The cavity was normal. There is increased right ventricular wall thickness. There is no aneurysm seen. Left Atrium: Left atrial size was normal in size. No left atrial/left atrial appendage thrombus was detected. The left atrial appendage is well visualized and there is no evidence of thrombus present. Right Atrium: Right atrial size was normal in size. Interatrial Septum: No atrial level shunt detected by color flow Doppler. Agitated saline contrast bubble study was negative, with no evidence of any interatrial shunt. Pericardium: A small pericardial effusion is present. The pericardial effusion is surrounding the apex. Measured 0.3 cm. Mitral Valve: The mitral valve is normal in structure. Mitral valve regurgitation is trivial by color flow Doppler. The MR jet is centrally-directed. There is No evidence of mitral stenosis. Tricuspid Valve: The tricuspid valve was normal in structure. Tricuspid valve regurgitation is trivial by color flow Doppler. No evidence of tricuspid stenosis is present. Aortic Valve: The aortic valve is normal in structure. Aortic valve regurgitation was not  visualized by color flow Doppler. There is no stenosis of the aortic valve. Pulmonic Valve: The pulmonic valve was normal in structure, with normal. Pulmonic valve regurgitation is not visualized by color flow Doppler. Aorta: The aortic root and ascending aorta are normal in size and structure. Shunts: There is no evidence of an atrial septal defect. +--------------+--------++ LEFT VENTRICLE         +--------------+--------++ PLAX 2D                +--------------+--------++ LVOT diam:    2.30 cm  +--------------+--------++ LVOT Area:    4.15 cm +--------------+--------++                        +--------------+--------++  +------------------+---------++  LV Volumes (MOD)            +------------------+---------++ LV area d, A2C:   21.40 cm +------------------+---------++ LV area d, A4C:   29.40 cm +------------------+---------++ LV area s, A2C:   9.48 cm  +------------------+---------++ LV area s, A4C:   15.20 cm +------------------+---------++ LV major d, A2C:  6.50 cm   +------------------+---------++ LV major d, A4C:  8.23 cm   +------------------+---------++ LV major s, A2C:  4.88 cm   +------------------+---------++ LV major s, A4C:  6.72 cm   +------------------+---------++ LV vol d, MOD A2C:57.4 ml   +------------------+---------++ LV vol d, MOD A4C:86.1 ml   +------------------+---------++ LV vol s, MOD A2C:16.0 ml   +------------------+---------++ LV vol s, MOD A4C:28.9 ml   +------------------+---------++ LV SV MOD A2C:    41.4 ml   +------------------+---------++ LV SV MOD A4C:    86.1 ml   +------------------+---------++ LV SV MOD BP:     53.6 ml   +------------------+---------++ +-------------+-----------++ AORTIC VALVE             +-------------+-----------++ AV Vmax:     121.00 cm/s +-------------+-----------++ AV Vmean:    77.400 cm/s +-------------+-----------++ AV VTI:      0.293 m      +-------------+-----------++ AV Peak Grad:5.9 mmHg    +-------------+-----------++ AV Mean Grad:3.0 mmHg    +-------------+-----------++  +--------------+-------+ SHUNTS                +--------------+-------+ Systemic Diam:2.30 cm +--------------+-------+  Eilene Ghazi MD Electronically signed by Eilene Ghazi MD Signature Date/Time: 07/09/2020/3:40:13 PM    Final    VAS US DOPPLER PRE CABG  Result Date: 07/05/2020 PREOPERATIVE VASCULAR EVALUATION  Indications:      Pre-CABG. Risk Factors:     Hypertension, hyperlipidemia, no history of smoking, coronary                   artery disease. Comparison Study: No previous Performing Technologist: Clint Guy RVT  Examination Guidelines: A complete evaluation includes B-mode imaging, spectral Doppler, color Doppler, and power Doppler as needed of all accessible portions of each vessel. Bilateral testing is considered an integral part of a complete examination. Limited examinations for reoccurring indications may be performed as noted.  Right Carotid Findings: +----------+--------+--------+--------+--------+--------+           PSV cm/sEDV cm/sStenosisDescribeComments +----------+--------+--------+--------+--------+--------+ CCA Prox  94      11                               +----------+--------+--------+--------+--------+--------+ CCA Distal91      20                               +----------+--------+--------+--------+--------+--------+ ICA Prox  72      15      Normal                   +----------+--------+--------+--------+--------+--------+ ICA Distal61      22                               +----------+--------+--------+--------+--------+--------+ ECA       114     15                               +----------+--------+--------+--------+--------+--------+ Portions of  this table do not appear on this page. +----------+--------+-------+--------+------------+           PSV cm/sEDV cmsDescribeArm Pressure  +----------+--------+-------+--------+------------+ Subclavian94                                  +----------+--------+-------+--------+------------+ +---------+--------+--+--------+--+ VertebralPSV cm/s49EDV cm/s12 +---------+--------+--+--------+--+ Left Carotid Findings: +----------+--------+--------+--------+--------+--------+           PSV cm/sEDV cm/sStenosisDescribeComments +----------+--------+--------+--------+--------+--------+ CCA Prox  103     16                               +----------+--------+--------+--------+--------+--------+ CCA Distal101     23                               +----------+--------+--------+--------+--------+--------+ ICA Prox  83      22      Normal                   +----------+--------+--------+--------+--------+--------+ ICA Distal83      27                               +----------+--------+--------+--------+--------+--------+ ECA       110     16                               +----------+--------+--------+--------+--------+--------+ +----------+--------+--------+--------+------------+ SubclavianPSV cm/sEDV cm/sDescribeArm Pressure +----------+--------+--------+--------+------------+           132                                  +----------+--------+--------+--------+------------+ +---------+--------+--+--------+--+ VertebralPSV cm/s55EDV cm/s19 +---------+--------+--+--------+--+  ABI Findings: +--------+------------------+-----+---------+--------+ Right   Rt Pressure (mmHg)IndexWaveform Comment  +--------+------------------+-----+---------+--------+ [id number]                    triphasic         +--------+------------------+-----+---------+--------+ PTA     134               1.11 biphasic          +--------+------------------+-----+---------+--------+ DP      131               1.08 biphasic          +--------+------------------+-----+---------+--------+  +--------+------------------+-----+---------+-------+ Left    Lt Pressure (mmHg)IndexWaveform Comment +--------+------------------+-----+---------+-------+ [id number]                    triphasic        +--------+------------------+-----+---------+-------+ PTA     140               1.16 biphasic         +--------+------------------+-----+---------+-------+ DP      132               1.09 biphasic         +--------+------------------+-----+---------+-------+  Right Doppler Findings: +-----------+--------+-----+---------+--------+ Site       PressureIndexDoppler  Comments +-----------+--------+-----+---------+--------+ Brachial   118          triphasic         +-----------+--------+-----+---------+--------+ Radial  triphasic         +-----------+--------+-----+---------+--------+ Ulnar                   triphasic         +-----------+--------+-----+---------+--------+ Palmar Arch             triphasic         +-----------+--------+-----+---------+--------+  Left Doppler Findings: +-----------+--------+-----+---------+--------+ Site       PressureIndexDoppler  Comments +-----------+--------+-----+---------+--------+ Brachial   121          triphasic         +-----------+--------+-----+---------+--------+ Radial                  triphasic         +-----------+--------+-----+---------+--------+ Ulnar                   triphasic         +-----------+--------+-----+---------+--------+ Palmar Arch             triphasic         +-----------+--------+-----+---------+--------+  Summary: Right Carotid: There was no evidence of thrombus, dissection, atherosclerotic                plaque or stenosis in the cervical carotid system. Left Carotid: There was no evidence of thrombus, dissection, atherosclerotic               plaque or stenosis in the cervical carotid system. Vertebrals:  Bilateral vertebral arteries demonstrate  antegrade flow. Subclavians: Normal flow hemodynamics were seen in bilateral subclavian              arteries. Right ABI: Resting right ankle-brachial index is within normal range. No evidence of significant right lower extremity arterial disease. Left ABI: Resting left ankle-brachial index is within normal range. No evidence of significant left lower extremity arterial disease. Right Upper Extremity: Doppler waveform obliterate with right radial compression. Doppler waveform obliterate with right ulnar compression. Left Upper Extremity: Doppler waveforms remain within normal limits with left radial compression. Doppler waveform obliterate with left ulnar compression.   Electronically signed by Waverly Ferrari MD on 07/05/2020 at 3:40:56 PM.    Final    Discharge Instructions    Amb Referral to Cardiac Rehabilitation   Complete by: As directed    Diagnosis: CABG   CABG X ___: 2   After initial evaluation and assessments completed: Virtual Based Care may be provided alone or in conjunction with Phase 2 Cardiac Rehab based on patient barriers.: Yes      Discharge Medications: Allergies as of 07/13/2020   No Known Allergies     Medication List    STOP taking these medications   ibuprofen 200 MG tablet Commonly known as: ADVIL   losartan-hydrochlorothiazide 100-12.5 MG tablet Commonly known as: HYZAAR   nitroGLYCERIN 0.4 MG SL tablet Commonly known as: Nitrostat   predniSONE 5 MG (21) Tbpk tablet Commonly known as: STERAPRED UNI-PAK 21 TAB     TAKE these medications   amLODipine 5 MG tablet Commonly known as: NORVASC Take 1 tablet (5 mg total) by mouth daily.   aspirin 325 MG EC tablet Take 1 tablet (325 mg total) by mouth daily. What changed:   medication strength  how much to take  additional instructions   atorvastatin 80 MG tablet Commonly known as: LIPITOR Take 1 tablet (80 mg total) by mouth at bedtime. What changed: when to take this   famotidine 40 MG  tablet Commonly known as: PEPCID  Take 40 mg by mouth daily.   furosemide 40 MG tablet Commonly known as: LASIX Take 1 tablet (40 mg total) by mouth daily.   Metoprolol Tartrate 37.5 MG Tabs Take 37.5 mg by mouth 2 (two) times daily. What changed:   medication strength  how much to take  when to take this   omeprazole 20 MG capsule Commonly known as: PRILOSEC Take 20 mg by mouth daily.   oxyCODONE 5 MG immediate release tablet Commonly known as: Oxy IR/ROXICODONE Take 1 tablet (5 mg total) by mouth every 6 (six) hours as needed for severe pain.   potassium chloride SA 20 MEQ tablet Commonly known as: KLOR-CON Take 2 tablets (40 mEq total) by mouth daily.   tamsulosin 0.4 MG Caps capsule Commonly known as: FLOMAX Take 0.4 mg by mouth daily.   TURMERIC-GINGER PO Take 2 tablets by mouth See admin instructions. Five times a week            Durable Medical Equipment  (From admission, onward)         Start     Ordered   07/12/20 1230  For home use only DME 3 n 1  Once        07/12/20 1230         The patient has been discharged on:   1.Beta Blocker:  Yes [ x  ]                              No   [   ]                              If No, reason:  2.Ace Inhibitor/ARB: Yes [   ]                                     No  [ x   ]                                     If No, reason: Norvasc  3.Statin:   Yes [x   ]                  No  [   ]                  If No, reason:  4.Ecasa:  Yes  [ x  ]                  No   [   ]                  If No, reason:    Follow Up Appointments:  Follow-up Information    Corliss Skains, MD. Go on 08/02/2020.   Specialty: Cardiothoracic Surgery Why: Appointment time is at 10:50 am Contact information: [address] [place] [address] [phone]        Briella Hobday Lerner, DO. Go on 07/18/2020.   Specialties: Cardiology, Radiology, Vascular Surgery Why: Appointment time is at 2:00 pm Contact  information: [address] [place] [address] [phone]        Elias Else, MD. Call in 1 day(s).  Specialty: Family Medicine Contact information: [phone] [address] [place] [id number] [phone]               Signed: Orson Apeessa N ContePA-C 07/13/2020, 7:50 AM

## 2020-07-10 NOTE — Progress Notes (Signed)
TCTS BRIEF SICU PROGRESS NOTE  1 Day Post-Op  S/P Procedure(s) (LRB): CORONARY ARTERY BYPASS GRAFTING (CABG) X TWO USING LEFT INTERNAL MAMMARY ARTERY AND LEFT RADIAL ARTERY (N/A) RADIAL ARTERY HARVEST (Left) TRANSESOPHAGEAL ECHOCARDIOGRAM (TEE) (N/A)   Stable day NSR w/ stable BP Breathing comfortably on room air UOP adequate Labs okay  Plan: Continue current plan  Purcell Nails, MD 07/10/2020 6:55 PM

## 2020-07-10 NOTE — Progress Notes (Addendum)
TCTS DAILY ICU PROGRESS NOTE                   [address]            [place] [address]          [phone]   1 Day Post-Op Procedure(s) (LRB): CORONARY ARTERY BYPASS GRAFTING (CABG) X TWO USING LEFT INTERNAL MAMMARY ARTERY AND LEFT RADIAL ARTERY (N/A) RADIAL ARTERY HARVEST (Left) TRANSESOPHAGEAL ECHOCARDIOGRAM (TEE) (N/A)  Total Length of Stay:  LOS: 1 day   Subjective: Patient with sternal, incisional pain this am. He also has some nausea.  Objective: Vital signs in last 24 hours: Temp:  [95.72 F (35.4 C)-101.66 F (38.7 C)] 99.32 F (37.4 C) (04/13 0800) Pulse Rate:  [73-96] 80 (04/13 0800) Cardiac Rhythm: Normal sinus rhythm (04/13 0800) Resp:  [12-33] 20 (04/13 0800) BP: (89-142)/(50-77) 135/66 (04/13 0800) SpO2:  [95 %-100 %] 97 % (04/13 0800) Arterial Line BP: (88-154)/(45-70) 129/55 (04/13 0800) FiO2 (%):  [40 %-50 %] 40 % (04/12 1615) Weight:  [94.3 kg] 94.3 kg (04/13 0500)  Filed Weights   07/09/20 0624 07/10/20 0500  Weight: 90.7 kg 94.3 kg    Weight change: 3.581 kg   Hemodynamic parameters for last 24 hours: CVP:  [1 mmHg-17 mmHg] 10 mmHg CO:  [4.5 L/min-6 L/min] 4.5 L/min  Intake/Output from previous day: 04/12 0701 - 04/13 0700 In: 5438.1 [I.V.:3461; Blood:335; IV Piggyback:1642.1] Out: 2409 [Urine:2920; Blood:529; Chest Tube:415]  Intake/Output this shift: No intake/output data recorded.  Current Meds: Scheduled Meds: . acetaminophen  1,000 mg Oral Q6H   Or  . acetaminophen (TYLENOL) oral liquid 160 mg/5 mL  1,000 mg Per Tube Q6H  . aspirin EC  325 mg Oral Daily   Or  . aspirin  324 mg Per Tube Daily  . atorvastatin  80 mg Oral QHS  . bisacodyl  10 mg Oral Daily   Or  . bisacodyl  10 mg Rectal Daily  . chlorhexidine  15 mL Mouth Rinse BID  . Chlorhexidine Gluconate Cloth  6 each Topical Daily  . Chlorhexidine Gluconate Cloth  6 each Topical Daily  . docusate sodium  200 mg Oral Daily  . mouth rinse  15 mL Mouth  Rinse q12n4p  . methocarbamol  500 mg Oral TID  . metoprolol tartrate  12.5 mg Oral BID   Or  . metoprolol tartrate  12.5 mg Per Tube BID  . [START ON 07/11/2020] pantoprazole  40 mg Oral Daily  . sodium chloride flush  10-40 mL Intracatheter Q12H  . sodium chloride flush  3 mL Intravenous Q12H   Continuous Infusions: . sodium chloride    . sodium chloride    . sodium chloride    . albumin human 12.5 g (07/09/20 2315)  . cefUROXime (ZINACEF)  IV Stopped (07/10/20 0436)  . dexmedetomidine (PRECEDEX) IV infusion Stopped (07/09/20 1446)  . DOBUTamine    . insulin 1.1 mL/hr at 07/10/20 0700  . lactated ringers    . lactated ringers 20 mL/hr at 07/10/20 0700  . niCARDipine 2.5 mg/hr (07/10/20 0700)  . nitroGLYCERIN Stopped (07/09/20 1235)  . norepinephrine (LEVOPHED) Adult infusion Stopped (07/09/20 1442)   PRN Meds:.sodium chloride, albumin human, dextrose, metoprolol tartrate, midazolam, morphine injection, ondansetron (ZOFRAN) IV, oxyCODONE, sodium chloride flush, sodium chloride flush, traMADol  General appearance: alert, cooperative and no distress Neurologic: intact Heart: RRR Lungs: Diminshed bibasilar breath sounds Abdomen: Soft, non tender, bowel sounds present Extremities: Bilateral LE edema.  Motor/sensory intact LUE Wound: Aquacel intact on sternum. LUE dressing partially removed and wound is clean and dry.   Lab Results: CBC: Recent Labs    07/09/20 1816 07/10/20 0415  WBC 8.9 11.2*  HGB 12.6* 11.4*  HCT 36.5* 32.8*  PLT 124* 135*   BMET:  Recent Labs    07/09/20 1816 07/10/20 0415  NA 136 134*  K 3.7 4.1  CL 109 107  CO2 24 22  GLUCOSE 132* 131*  BUN 9 12  CREATININE 1.10 1.23  CALCIUM 8.0* 7.8*    CMET: Lab Results  Component Value Date   WBC 11.2 (H) 07/10/2020   HGB 11.4 (L) 07/10/2020   HCT 32.8 (L) 07/10/2020   PLT 135 (L) 07/10/2020   GLUCOSE 131 (H) 07/10/2020   CHOL 143 06/28/2020   TRIG 60 06/28/2020   HDL 41 06/28/2020   LDLDIRECT  82 06/28/2020   LDLCALC 90 06/28/2020   ALT 21 07/05/2020   AST 22 07/05/2020   NA 134 (L) 07/10/2020   K 4.1 07/10/2020   CL 107 07/10/2020   CREATININE 1.23 07/10/2020   BUN 12 07/10/2020   CO2 22 07/10/2020   INR 1.4 (H) 07/09/2020   HGBA1C 5.5 07/05/2020      PT/INR:  Recent Labs    07/09/20 1228  LABPROT 17.2*  INR 1.4*   Radiology: [facility] Chest Port 1 View  Result Date: 07/10/2020 CLINICAL DATA:  Chest pain.  Status postextubation EXAM: PORTABLE CHEST 1 VIEW COMPARISON:  July 09, 2020 FINDINGS: Endotracheal tube and nasogastric tube have been removed. Central catheter tip is in the superior vena cava. Mediastinal drain and left chest tube again noted. No pneumothorax. There is left lower lobe atelectatic change. There is mild consolidation medial aspect of each lung base. There is stable cardiac enlargement with pulmonary vascularity normal. No evident adenopathy. No bone lesions. IMPRESSION: Tube and catheter positions as described without evident pneumothorax. Medial airspace opacity likely due to atelectasis with potential superimposed pneumonia in the medial lung bases noted. There is also left lower lobe region atelectasis. Stable cardiomegaly. Electronically Signed   By: Bretta Bang III M.D.   On: 07/10/2020 08:18   DG Chest Port 1 View  Result Date: 07/09/2020 CLINICAL DATA:  Status post CABG EXAM: PORTABLE CHEST 1 VIEW COMPARISON:  07/05/2020 FINDINGS: Endotracheal tube with the tip 4.5 cm above the carina. Nasogastric tube coursing below the diaphragm. Right jugular central venous catheter with the tip projecting over the SVC. Left-sided chest tube. Mediastinal drain. No focal consolidation. No pleural effusion or pneumothorax. Heart and mediastinal contours are unremarkable. Interval CABG. No acute osseous abnormality. IMPRESSION: 1. Support lines and tubing in satisfactory position. 2. No acute cardiopulmonary disease. Electronically Signed   By: Elige Ko   On:  07/09/2020 13:27     Assessment/Plan: S/P Procedure(s) (LRB): CORONARY ARTERY BYPASS GRAFTING (CABG) X TWO USING LEFT INTERNAL MAMMARY ARTERY AND LEFT RADIAL ARTERY (N/A) RADIAL ARTERY HARVEST (Left) TRANSESOPHAGEAL ECHOCARDIOGRAM (TEE) (N/A)   1. CV-CO/CI 6.4/3.28. SR with HR in the 70's. On Cardene drip at 2.5 mg/hr and Lopressor 12.5 mg bid. Hope to wean off Cardene drip and start Amlodipine. 2. Pulmonary-on 3 liters of oxygen via [place]. Will wean as able over the next few days. Chest tubes with 415 cc last 24 hours. CXR this am appears to show no pneumothorax, bibasilar atelectasis, and cardiomegaly. Chest tubes to remain for now. Encourage incentive spirometer. 3. Volume overload-will diurese this am 4. Expected post op  blood loss anemia-H and H this am slightly decreased to 11.4 and 32.8 5. CBGs 128/140/137. Pre op HGA1C 5.5. Will stop accu checks and SS PRN after transfer 6. Creatinine this am 1.23 7. Mild thrombocytopenia-platelets this am up to 135,000 8. GI-Zofran and Reglan for nausea. Monitor 9. Please see progression orders  Donielle Margaretann Loveless PA-C 07/10/2020 8:21 AM   Agree with above Doing well On norvasc for radial harvest Floor tomorrow

## 2020-07-11 ENCOUNTER — Inpatient Hospital Stay (HOSPITAL_COMMUNITY): Payer: Federal, State, Local not specified - PPO

## 2020-07-11 DIAGNOSIS — R0603 Acute respiratory distress: Secondary | ICD-10-CM | POA: Diagnosis not present

## 2020-07-11 DIAGNOSIS — I517 Cardiomegaly: Secondary | ICD-10-CM | POA: Diagnosis not present

## 2020-07-11 LAB — BASIC METABOLIC PANEL
Anion gap: 8 (ref 5–15)
BUN: 13 mg/dL (ref 8–23)
CO2: 24 mmol/L (ref 22–32)
Calcium: 8.4 mg/dL — ABNORMAL LOW (ref 8.9–10.3)
Chloride: 102 mmol/L (ref 98–111)
Creatinine, Ser: 1.24 mg/dL (ref 0.61–1.24)
GFR, Estimated: >60 mL/min (ref >60)
Glucose, Bld: 152 mg/dL — ABNORMAL HIGH (ref 70–99)
Potassium: 3.8 mmol/L (ref 3.5–5.1)
Sodium: 134 mmol/L — ABNORMAL LOW (ref 135–145)

## 2020-07-11 LAB — GLUCOSE, CAPILLARY
Glucose-Capillary: 111 mg/dL — ABNORMAL HIGH (ref 70–99)
Glucose-Capillary: 119 mg/dL — ABNORMAL HIGH (ref 70–99)
Glucose-Capillary: 131 mg/dL — ABNORMAL HIGH (ref 70–99)
Glucose-Capillary: 139 mg/dL — ABNORMAL HIGH (ref 70–99)
Glucose-Capillary: 150 mg/dL — ABNORMAL HIGH (ref 70–99)
Glucose-Capillary: 165 mg/dL — ABNORMAL HIGH (ref 70–99)
Glucose-Capillary: 82 mg/dL (ref 70–99)

## 2020-07-11 LAB — CBC
HCT: 34.9 % — ABNORMAL LOW (ref 39.0–52.0)
Hemoglobin: 12.2 g/dL — ABNORMAL LOW (ref 13.0–17.0)
MCH: 29.3 pg (ref 26.0–34.0)
MCHC: 35 g/dL (ref 30.0–36.0)
MCV: 83.9 fL (ref 80.0–100.0)
Platelets: 154 10*3/uL (ref 150–400)
RBC: 4.16 MIL/uL — ABNORMAL LOW (ref 4.22–5.81)
RDW: 12.7 % (ref 11.5–15.5)
WBC: 12.5 10*3/uL — ABNORMAL HIGH (ref 4.0–10.5)
nRBC: 0 % (ref 0.0–0.2)

## 2020-07-11 MED ORDER — SODIUM CHLORIDE 0.9% FLUSH: 3.0000 mL | INTRAVENOUS | Status: DC | PRN

## 2020-07-11 MED ORDER — FUROSEMIDE 40 MG PO TABS
40.0000 mg | ORAL_TABLET | Freq: Every day | ORAL | Status: DC
  Administered 2020-07-12 – 2020-07-13 (×2): 40 mg via ORAL
  Filled 2020-07-11 (×2): qty 1

## 2020-07-11 MED ORDER — FUROSEMIDE 40 MG PO TABS: 40.0000 mg | ORAL_TABLET | Freq: Every day | ORAL | Status: DC

## 2020-07-11 MED ORDER — ALUM & MAG HYDROXIDE-SIMETH 200-200-20 MG/5ML PO SUSP
30.0000 mL | Freq: Four times a day (QID) | ORAL | Status: DC | PRN
  Administered 2020-07-12 – 2020-07-13 (×3): 30 mL via ORAL
  Filled 2020-07-11 (×4): qty 30

## 2020-07-11 MED ORDER — SODIUM CHLORIDE 0.9 % IV SOLN: 250.0000 mL | INTRAVENOUS | Status: DC | PRN

## 2020-07-11 MED ORDER — ORAL CARE MOUTH RINSE
15.0000 mL | Freq: Two times a day (BID) | OROMUCOSAL | Status: DC
  Administered 2020-07-11 – 2020-07-12 (×2): 15 mL via OROMUCOSAL

## 2020-07-11 MED ORDER — POTASSIUM CHLORIDE CRYS ER 20 MEQ PO TBCR: 40.0000 meq | EXTENDED_RELEASE_TABLET | Freq: Every day | ORAL | Status: DC

## 2020-07-11 MED ORDER — METOPROLOL TARTRATE 25 MG PO TABS
25.0000 mg | ORAL_TABLET | Freq: Two times a day (BID) | ORAL | Status: DC
  Administered 2020-07-11 (×2): 25 mg via ORAL
  Filled 2020-07-11 (×2): qty 1

## 2020-07-11 MED ORDER — SODIUM CHLORIDE 0.9% FLUSH: 3.0000 mL | Freq: Two times a day (BID) | INTRAVENOUS | Status: DC

## 2020-07-11 MED ORDER — FUROSEMIDE 10 MG/ML IJ SOLN
40.0000 mg | Freq: Once | INTRAMUSCULAR | Status: AC
  Administered 2020-07-11: 40 mg via INTRAVENOUS
  Filled 2020-07-11: qty 4

## 2020-07-11 MED ORDER — ~~LOC~~ CARDIAC SURGERY, PATIENT & FAMILY EDUCATION: Freq: Once | Status: DC

## 2020-07-11 MED FILL — Mannitol IV Soln 20%: INTRAVENOUS | Qty: 500 | Status: AC

## 2020-07-11 MED FILL — Potassium Chloride Inj 2 mEq/ML: INTRAVENOUS | Qty: 40 | Status: AC

## 2020-07-11 MED FILL — Heparin Sodium (Porcine) Inj 1000 Unit/ML: INTRAMUSCULAR | Qty: 30 | Status: AC

## 2020-07-11 MED FILL — Sodium Chloride IV Soln 0.9%: INTRAVENOUS | Qty: 2000 | Status: AC

## 2020-07-11 MED FILL — Electrolyte-R (PH 7.4) Solution: INTRAVENOUS | Qty: 3000 | Status: AC

## 2020-07-11 MED FILL — Sodium Bicarbonate IV Soln 8.4%: INTRAVENOUS | Qty: 50 | Status: AC

## 2020-07-11 MED FILL — Calcium Chloride Inj 10%: INTRAVENOUS | Qty: 10 | Status: AC

## 2020-07-11 MED FILL — Heparin Sodium (Porcine) Inj 1000 Unit/ML: INTRAMUSCULAR | Qty: 10 | Status: AC

## 2020-07-11 MED FILL — Lidocaine HCl Local Preservative Free (PF) Inj 2%: INTRAMUSCULAR | Qty: 15 | Status: AC

## 2020-07-11 NOTE — Progress Notes (Addendum)
TCTS DAILY ICU PROGRESS NOTE                   [address]            [place] [address]          [phone]   2 Days Post-Op Procedure(s) (LRB): CORONARY ARTERY BYPASS GRAFTING (CABG) X TWO USING LEFT INTERNAL MAMMARY ARTERY AND LEFT RADIAL ARTERY (N/A) RADIAL ARTERY HARVEST (Left) TRANSESOPHAGEAL ECHOCARDIOGRAM (TEE) (N/A)  Total Length of Stay:  LOS: 2 days   Subjective: Patient sitting in chair. He denies abdominal pain, nausea, or vomiting.   Objective: Vital signs in last 24 hours: Temp:  [98.6 F (37 C)-99.9 F (37.7 C)] 99.9 F (37.7 C) (04/14 0400) Pulse Rate:  [77-101] 101 (04/14 0700) Cardiac Rhythm: Normal sinus rhythm (04/14 0400) Resp:  [17-33] 27 (04/14 0700) BP: (90-148)/(55-90) 138/66 (04/14 0600) SpO2:  [87 %-97 %] 92 % (04/14 0700) Arterial Line BP: (113-142)/(43-61) 124/61 (04/13 1000) Weight:  [93.8 kg] 93.8 kg (04/14 0600)  Filed Weights   07/09/20 0624 07/10/20 0500 07/11/20 0600  Weight: 90.7 kg 94.3 kg 93.8 kg    Weight change: -0.5 kg   Hemodynamic parameters for last 24 hours: CVP:  [10 mmHg-14 mmHg] 14 mmHg  Intake/Output from previous day: 04/13 0701 - 04/14 0700 In: 494.3 [P.O.:100; I.V.:184.3; IV Piggyback:200] Out: 930 [Urine:920; Chest Tube:10]  Intake/Output this shift: No intake/output data recorded.  Current Meds: Scheduled Meds: . acetaminophen  1,000 mg Oral Q6H   Or  . acetaminophen (TYLENOL) oral liquid 160 mg/5 mL  1,000 mg Per Tube Q6H  . amLODipine  5 mg Oral Daily  . aspirin EC  325 mg Oral Daily   Or  . aspirin  324 mg Per Tube Daily  . atorvastatin  80 mg Oral QHS  . bisacodyl  10 mg Oral Daily   Or  . bisacodyl  10 mg Rectal Daily  . chlorhexidine  15 mL Mouth Rinse BID  . Chlorhexidine Gluconate Cloth  6 each Topical Daily  . Chlorhexidine Gluconate Cloth  6 each Topical Daily  . docusate sodium  200 mg Oral Daily  . enoxaparin (LOVENOX) injection  40 mg Subcutaneous QHS  . insulin  aspart  0-24 Units Subcutaneous Q4H  . lidocaine  2 patch Transdermal Q24H  . mouth rinse  15 mL Mouth Rinse q12n4p  . methocarbamol  500 mg Oral TID  . metoCLOPramide (REGLAN) injection  10 mg Intravenous Q6H  . metoprolol tartrate  12.5 mg Oral BID   Or  . metoprolol tartrate  12.5 mg Per Tube BID  . pantoprazole  40 mg Oral Daily  . sodium chloride flush  10-40 mL Intracatheter Q12H  . sodium chloride flush  3 mL Intravenous Q12H   Continuous Infusions: . sodium chloride    . sodium chloride    . sodium chloride    . lactated ringers    . lactated ringers Stopped (07/10/20 1116)   PRN Meds:.sodium chloride, metoprolol tartrate, midazolam, morphine injection, ondansetron (ZOFRAN) IV, oxyCODONE, sodium chloride flush, sodium chloride flush, traMADol  General appearance: alert, cooperative and no distress Neurologic: intact Heart: RRR Lungs: Diminshed bibasilar breath sounds Abdomen: Soft, non tender, bowel sounds present Extremities: ++ LE edema. Motor/sensory intact LUE Wound: Aquacel intact on sternum. LUE dressing partially removed and wound is clean and dry.   Lab Results: CBC: Recent Labs    07/10/20 1700 07/11/20 0447  WBC 14.2* 12.5*  HGB  12.4* 12.2*  HCT 35.9* 34.9*  PLT 155 154   BMET:  Recent Labs    07/10/20 1700 07/11/20 0447  NA 131* 134*  K 4.0 3.8  CL 100 102  CO2 24 24  GLUCOSE 152* 152*  BUN 13 13  CREATININE 1.20 1.24  CALCIUM 8.3* 8.4*    CMET: Lab Results  Component Value Date   WBC 12.5 (H) 07/11/2020   HGB 12.2 (L) 07/11/2020   HCT 34.9 (L) 07/11/2020   PLT 154 07/11/2020   GLUCOSE 152 (H) 07/11/2020   CHOL 143 06/28/2020   TRIG 60 06/28/2020   HDL 41 06/28/2020   LDLDIRECT 82 06/28/2020   LDLCALC 90 06/28/2020   ALT 21 07/05/2020   AST 22 07/05/2020   NA 134 (L) 07/11/2020   K 3.8 07/11/2020   CL 102 07/11/2020   CREATININE 1.24 07/11/2020   BUN 13 07/11/2020   CO2 24 07/11/2020   INR 1.4 (H) 07/09/2020   HGBA1C 5.5  07/05/2020    PT/INR:  Recent Labs    07/09/20 1228  LABPROT 17.2*  INR 1.4*   Radiology: DG Chest Port 1 View  Result Date: 07/11/2020 CLINICAL DATA:  Respiratory distress EXAM: PORTABLE CHEST 1 VIEW COMPARISON:  07/10/2020 FINDINGS: Left chest tube and mediastinal drain have been removed. No pneumothorax. Interval development of lucency below the right hemidiaphragm and extending across midline into the epigastrium. While this may simply represent gas within the transverse colon, free intraperitoneal gas below the hemidiaphragm is not excluded. Lung volumes are small, however, pulmonary insufflation is stable when compared to prior examination. Right internal jugular central venous catheter is unchanged. Median sternotomy has been performed. Cardiac size is mildly enlarged, unchanged. Pulmonary vascularity is normal. IMPRESSION: Interval mediastinal drain removal. Possible free intraperitoneal gas. This could be confirmed with upright or decubitus abdominal radiographs. Interval left chest tube removal.  No pneumothorax. Stable pulmonary hypoinflation. These results will be called to the ordering clinician or representative by the Radiologist Assistant, and communication documented in the PACS or [employer]. Electronically Signed   By: Helyn Numbers MD   On: 07/11/2020 06:39     Assessment/Plan: S/P Procedure(s) (LRB): CORONARY ARTERY BYPASS GRAFTING (CABG) X TWO USING LEFT INTERNAL MAMMARY ARTERY AND LEFT RADIAL ARTERY (N/A) RADIAL ARTERY HARVEST (Left) TRANSESOPHAGEAL ECHOCARDIOGRAM (TEE) (N/A)   1. CV- SR with HR in the 90's. On Lopressor 12.5 mg bid, Amlodipine 5 mg daily. Will increase Lopressor to better BP/HR control 2. Pulmonary-on 2 liters of oxygen via [place]. Will wean as able over the next few days. CXR this am appears to show no pneumothorax, bibasilar atelectasis, cardiomegaly, and possible free intraperitoneal gas. As discussed with Dr. Cliffton Asters, "free air" likely related  to surgery/chest tube placement as patient with no clinical abdominal symptoms. Encourage incentive spirometer. 3. Volume overload-will diurese with Lasix 40 mg IV this am then likely transition to oral in am 4. Expected post op blood loss anemia-H and H this am stable at 12.2 and 34.9 5. CBGs 126/82/150. Pre op HGA1C 5.5. Will stop accu checks and SS PRN after transfer 6. Creatinine this am 1.24 7. Mild thrombocytopenia resolved-platelets this am up to 154,000 8. Transfer  Brian Balls PA-C 07/11/2020 7:58 AM   Agree with above Doing well Will keep norvasc for radial harvest Will diurese today Transfer to floor  Brian Curry

## 2020-07-11 NOTE — Plan of Care (Signed)

## 2020-07-11 NOTE — Progress Notes (Signed)
Mobility Specialist - Progress Note   07/11/20 1527  Mobility  Activity Ambulated in hall  Level of Assistance Contact guard assist, steadying assist  Assistive Device None  Distance Ambulated (ft) 420 ft  Mobility Response Tolerated well  Mobility performed by Mobility specialist  $Mobility charge 1 Mobility   Pre-mobility: 86 HR, 91% SpO2 During mobility: 98 HR, 96% SpO2 Post-mobility: 90 HR, 91% SpO2  Pt ambulated on RA, he was able to stay ~96% during ambulation w/ pursed lip breathing. He had some unsteadiness while walking, but was otherwise asx. Pt back in bed after walk, wife in room.   Mamie Levers Mobility Specialist Mobility Specialist Phone: [phone]

## 2020-07-11 NOTE — Progress Notes (Signed)
Pt arrived to rm 23 from 2H. Initiated tele. Chg given. Oriented pt to the floor. Call bell within reach. VSS.   Lawson Radar, RN

## 2020-07-12 DIAGNOSIS — R531 Weakness: Secondary | ICD-10-CM | POA: Diagnosis not present

## 2020-07-12 LAB — CBC
HCT: 34.4 % — ABNORMAL LOW (ref 39.0–52.0)
Hemoglobin: 12.1 g/dL — ABNORMAL LOW (ref 13.0–17.0)
MCH: 28.9 pg (ref 26.0–34.0)
MCHC: 35.2 g/dL (ref 30.0–36.0)
MCV: 82.1 fL (ref 80.0–100.0)
Platelets: 164 10*3/uL (ref 150–400)
RBC: 4.19 MIL/uL — ABNORMAL LOW (ref 4.22–5.81)
RDW: 12.6 % (ref 11.5–15.5)
WBC: 11.4 10*3/uL — ABNORMAL HIGH (ref 4.0–10.5)
nRBC: 0 % (ref 0.0–0.2)

## 2020-07-12 LAB — BASIC METABOLIC PANEL
Anion gap: 6 (ref 5–15)
BUN: 14 mg/dL (ref 8–23)
CO2: 26 mmol/L (ref 22–32)
Calcium: 8.4 mg/dL — ABNORMAL LOW (ref 8.9–10.3)
Chloride: 100 mmol/L (ref 98–111)
Creatinine, Ser: 1.18 mg/dL (ref 0.61–1.24)
GFR, Estimated: >60 mL/min (ref >60)
Glucose, Bld: 132 mg/dL — ABNORMAL HIGH (ref 70–99)
Potassium: 3.4 mmol/L — ABNORMAL LOW (ref 3.5–5.1)
Sodium: 132 mmol/L — ABNORMAL LOW (ref 135–145)

## 2020-07-12 LAB — GLUCOSE, CAPILLARY
Glucose-Capillary: 126 mg/dL — ABNORMAL HIGH (ref 70–99)
Glucose-Capillary: 119 mg/dL — ABNORMAL HIGH (ref 70–99)

## 2020-07-12 MED ORDER — METOPROLOL TARTRATE 25 MG PO TABS
37.5000 mg | ORAL_TABLET | Freq: Two times a day (BID) | ORAL | Status: DC
  Administered 2020-07-12 – 2020-07-13 (×3): 37.5 mg via ORAL
  Filled 2020-07-12 (×3): qty 1

## 2020-07-12 MED ORDER — POTASSIUM CHLORIDE CRYS ER 20 MEQ PO TBCR
40.0000 meq | EXTENDED_RELEASE_TABLET | Freq: Once | ORAL | Status: AC
  Administered 2020-07-12: 40 meq via ORAL
  Filled 2020-07-12: qty 2

## 2020-07-12 MED ORDER — LACTULOSE 10 GM/15ML PO SOLN
20.0000 g | Freq: Once | ORAL | Status: AC
  Administered 2020-07-12: 20 g via ORAL
  Filled 2020-07-12: qty 30

## 2020-07-12 MED ORDER — POTASSIUM CHLORIDE CRYS ER 20 MEQ PO TBCR
40.0000 meq | EXTENDED_RELEASE_TABLET | Freq: Every day | ORAL | Status: DC
  Administered 2020-07-12 – 2020-07-13 (×2): 40 meq via ORAL
  Filled 2020-07-12: qty 2

## 2020-07-12 MED ORDER — ASPIRIN 325 MG PO TBEC: 325.0000 mg | DELAYED_RELEASE_TABLET | Freq: Every day | ORAL | 0 refills | Status: DC

## 2020-07-12 NOTE — Progress Notes (Addendum)
CARDIAC REHAB PHASE I   PRE:  Rate/Rhythm: 95 SR  BP:  Sitting: 151/77      SaO2: 96 RA  MODE:  Ambulation: 420 ft   POST:  Rate/Rhythm: 112 ST  BP:  Sitting: 169/80    SaO2: 98 RA   Pt ambulated 424ft in hallway standby assist with slow, steady gait. Pt with periods of dizziness, thinks it was the pain medicine he took. Pt returned to bed. D/c education completed with pt and wife. Pt educated on importance site care and monitoring incisions daily. Encouraged continued IS use, walks, and sternal precautions. Pt given in-the-tube sheet along with heart healthy diet. Reviewed restrictions and exercise guidelines. Requesting 3-in-1 for home use, RN aware. Will refer to CRP II [place].  [id number] Reynold Bowen, RN BSN 07/12/2020 10:39 AM

## 2020-07-12 NOTE — Progress Notes (Addendum)
      [address]       [place] [address]             [phone]        3 Days Post-Op Procedure(s) (LRB): CORONARY ARTERY BYPASS GRAFTING (CABG) X TWO USING LEFT INTERNAL MAMMARY ARTERY AND LEFT RADIAL ARTERY (N/A) RADIAL ARTERY HARVEST (Left) TRANSESOPHAGEAL ECHOCARDIOGRAM (TEE) (N/A)  Subjective: Patient with a lot of gas. He denies abdominal pain or nausea.  Objective: Vital signs in last 24 hours: Temp:  [97.8 F (36.6 C)-100 F (37.8 C)] 99.2 F (37.3 C) (04/15 0429) Pulse Rate:  [89-100] 94 (04/15 0429) Cardiac Rhythm: Normal sinus rhythm (04/14 1900) Resp:  [16-22] 20 (04/15 0429) BP: (120-166)/(71-92) 120/75 (04/15 0429) SpO2:  [92 %-97 %] 96 % (04/15 0429) FiO2 (%):  [28 %] 28 % (04/14 0822) Weight:  [91.6 kg] 91.6 kg (04/15 0429)  Pre op weight 90.7 kg Current Weight  07/12/20 91.6 kg       Intake/Output from previous day: 04/14 0701 - 04/15 0700 In: 250 [P.O.:240; I.V.:10] Out: 2175 [Urine:2175]   Physical Exam:  Cardiovascular: RRR Pulmonary: Slightly diminished bibasilar breath sounds Abdomen: Soft, non tender, bowel sounds present. Extremities: Mild bilateral lower extremity edema. Motor/sensory intact LUE Wounds: Aquacel removed and sternal wound is clean and dry.  No erythema or signs of infection. LUE wound is clean and dry  Lab Results: CBC: Recent Labs    07/11/20 0447 07/12/20 0257  WBC 12.5* 11.4*  HGB 12.2* 12.1*  HCT 34.9* 34.4*  PLT 154 164   BMET:  Recent Labs    07/11/20 0447 07/12/20 0257  NA 134* 132*  K 3.8 3.4*  CL 102 100  CO2 24 26  GLUCOSE 152* 132*  BUN 13 14  CREATININE 1.24 1.18  CALCIUM 8.4* 8.4*    PT/INR:  Lab Results  Component Value Date   INR 1.4 (H) 07/09/2020   INR 1.0 07/05/2020   ABG:  INR: Will add last result for INR, ABG once components are confirmed Will add last 4 CBG results once components are confirmed  Assessment/Plan:  1. CV - ST with HR in the low  100's at times. On Lopressor 25 mg bid and Amlodipine 5 mg daily. Will increase Lopressor to 37.5 mg bid. May need to restart low dose Losartan for better BP control. Monitor this am. 2.  Pulmonary - On room air. Will check PA/LAT CXR in am. Encourage incentive spirometer 3. Volume Overload - On Lasix 40 mg daily 4.  Expected post op acute blood loss anemia - H and H stable at 12.1 and 34.4 5. CBGs 131/111/126. Pre op HGA1C 5.5. Will stop accu checks and SS PRN  6. Supplement potassium 7. LOC constipation 8. Hopefully, home in am  Lelon Huh [facility] 07/12/2020,7:19 AM   Agree with above. Currently doing well. Does complain of some abdominal pain.  Patient has not had a bowel movement yet. Continue ambulation. We will aggressively diurese today. Likely home tomorrow.  Lara Palinkas Keane Scrape

## 2020-07-12 NOTE — Progress Notes (Signed)
Mobility Specialist - Progress Note   07/12/20 1537  Mobility  Activity Ambulated in hall  Level of Assistance Standby assist, set-up cues, supervision of patient - no hands on  Assistive Device None  Distance Ambulated (ft) 470 ft  Mobility Response Tolerated well  Mobility performed by Mobility specialist  $Mobility charge 1 Mobility   Pt asx throughout ambulation. VSS. Pt back in bed after walk, wife in room.   Mamie Levers Mobility Specialist Mobility Specialist Phone: [phone]

## 2020-07-13 ENCOUNTER — Inpatient Hospital Stay (HOSPITAL_COMMUNITY): Payer: Federal, State, Local not specified - PPO

## 2020-07-13 DIAGNOSIS — I517 Cardiomegaly: Secondary | ICD-10-CM | POA: Diagnosis not present

## 2020-07-13 DIAGNOSIS — J90 Pleural effusion, not elsewhere classified: Secondary | ICD-10-CM | POA: Diagnosis not present

## 2020-07-13 MED ORDER — POTASSIUM CHLORIDE CRYS ER 20 MEQ PO TBCR
40.0000 meq | EXTENDED_RELEASE_TABLET | Freq: Every day | ORAL | 0 refills | Status: DC
Start: 2020-07-13 — End: 2020-07-18

## 2020-07-13 MED ORDER — AMLODIPINE BESYLATE 5 MG PO TABS: 5.0000 mg | ORAL_TABLET | Freq: Every day | ORAL | 1 refills | Status: AC

## 2020-07-13 MED ORDER — FUROSEMIDE 40 MG PO TABS: 40.0000 mg | ORAL_TABLET | Freq: Every day | ORAL | 0 refills | Status: DC

## 2020-07-13 MED ORDER — METOPROLOL TARTRATE 37.5 MG PO TABS: 37.5000 mg | ORAL_TABLET | Freq: Two times a day (BID) | ORAL | 1 refills | Status: DC

## 2020-07-13 MED ORDER — OXYCODONE HCL 5 MG PO TABS: 5.0000 mg | ORAL_TABLET | Freq: Four times a day (QID) | ORAL | 0 refills | Status: DC | PRN

## 2020-07-13 NOTE — Progress Notes (Signed)
CARDIAC REHAB PHASE I  [phone] Patient sitting up in bed upon arrival. Wife at bedside shaving patient. Patient c/o nausea. Primary RN at bedside addressing symptoms. Wife questions home care including stair climbing and activity progression which was previously reviewed. All questions answered to patient and wife satisfaction. Patient to be discharged today. Patient and wife comfortable with independent ambulation in hallway once nausea has resolved.   Prayan Ulin Hoover Brunette RN, BSN

## 2020-07-13 NOTE — Progress Notes (Signed)
Pt feeling nauseous. Gave maalox, protonix, and ginger ale to sip.  Will recheck and give daily medications when stomach has settled. Will let PA know about delay in discharge. Thomas Hoff, RN

## 2020-07-13 NOTE — Plan of Care (Signed)
  Problem: Education: Goal: Will demonstrate proper wound care and an understanding of methods to prevent future damage Outcome: Adequate for Discharge Goal: Knowledge of disease or condition will improve Outcome: Adequate for Discharge Goal: Knowledge of the prescribed therapeutic regimen will improve Outcome: Adequate for Discharge Goal: Individualized Educational Video(s) Outcome: Adequate for Discharge   Problem: Activity: Goal: Risk for activity intolerance will decrease Outcome: Adequate for Discharge   Problem: Cardiac: Goal: Will achieve and/or maintain hemodynamic stability Outcome: Adequate for Discharge   Problem: Clinical Measurements: Goal: Postoperative complications will be avoided or minimized Outcome: Adequate for Discharge   Problem: Respiratory: Goal: Respiratory status will improve Outcome: Adequate for Discharge   Problem: Skin Integrity: Goal: Wound healing without signs and symptoms of infection Outcome: Adequate for Discharge   Problem: Urinary Elimination: Goal: Ability to achieve and maintain adequate renal perfusion and functioning will improve Outcome: Adequate for Discharge   Problem: Education: Goal: Knowledge of General Education information will improve Description: Including pain rating scale, medication(s)/side effects and non-pharmacologic comfort measures Outcome: Adequate for Discharge   Problem: Health Behavior/Discharge Planning: Goal: Ability to manage health-related needs will improve Outcome: Adequate for Discharge   Problem: Clinical Measurements: Goal: Ability to maintain clinical measurements within normal limits will improve Outcome: Adequate for Discharge Goal: Will remain free from infection Outcome: Adequate for Discharge Goal: Diagnostic test results will improve Outcome: Adequate for Discharge Goal: Respiratory complications will improve Outcome: Adequate for Discharge Goal: Cardiovascular complication will be  avoided Outcome: Adequate for Discharge

## 2020-07-13 NOTE — Discharge Instructions (Signed)
Discharge Instructions:  1. You may shower, please wash incisions daily with soap and water and keep dry.  If you wish to cover wounds with dressing you may do so but please keep clean and change daily.  No tub baths or swimming until incisions have completely healed.  If your incisions become red or develop any drainage please call our office at [phone]  2. No Driving until cleared by Dr. Lightfoot's office and you are no longer using narcotic pain medications  3. Monitor your weight daily.. Please use the same scale and weigh at same time... If you gain 3-5 lbs in 48 hours with associated lower extremity swelling, please contact our office at [phone]  4. Fever of 101.5 for at least 24 hours with no source, please contact our office at [phone]  5. Activity- up as tolerated, please walk at least 3 times per day.  Avoid strenuous activity, no lifting, pushing, or pulling with your arms over 8-10 lbs for a minimum of 6 weeks  6. If any questions or concerns arise, please do not hesitate to contact our office at [phone] 

## 2020-07-13 NOTE — Progress Notes (Signed)
Pt/family given discharge instructions, medication lists, follow up appointments, and when to call the doctor.  Pt/family verbalizes understanding. Pt given signs and symptoms of infection. Pt transported to main entrance via wheelchair. Wife picked up. Thomas Hoff, RN

## 2020-07-13 NOTE — TOC Transition Note (Signed)
Transition of Care [facility]) - CM/SW Discharge Note   Patient Details  Name: Brian Curry MRN: [id number] Date of Birth: 06-Jul-1960  [facility]) CM/SW Contact:  Leone Haven, RN Phone Number: 07/13/2020, 12:27 PM   Clinical Narrative:    Patient is for dc today, he needs a 3 n 1, this was delivered to his room a couple days ago and wife states they have it at home now.  He has no issues with transport or meds.    Final next level of care: Home/Self Care Barriers to Discharge: No Barriers Identified   Patient Goals and CMS Choice Patient states their goals for this hospitalization and ongoing recovery are:: return home   Choice offered to / list presented to : NA  Discharge Placement                       Discharge Plan and Services                DME Arranged: 3-N-1 DME Agency: [employer] Date DME Agency Contacted: 07/10/20 Time DME Agency Contacted: 1300 Representative spoke with at DME Agency: [facility] Arranged: NA          Social Determinants of Health (SDOH) Interventions     Readmission Risk Interventions No flowsheet data found.

## 2020-07-13 NOTE — Progress Notes (Signed)
      [address]       [place] [address]             [phone]      4 Days Post-Op Procedure(s) (LRB): CORONARY ARTERY BYPASS GRAFTING (CABG) X TWO USING LEFT INTERNAL MAMMARY ARTERY AND LEFT RADIAL ARTERY (N/A) RADIAL ARTERY HARVEST (Left) TRANSESOPHAGEAL ECHOCARDIOGRAM (TEE) (N/A) Subjective: Feels okay this morning, no issues overnight. He did have a bowel movement.   Objective: Vital signs in last 24 hours: Temp:  [98.5 F (36.9 C)-99.2 F (37.3 C)] 98.8 F (37.1 C) (04/16 0306) Pulse Rate:  [83-98] 83 (04/16 0306) Cardiac Rhythm: Normal sinus rhythm (04/15 1900) Resp:  [19-20] 19 (04/16 0306) BP: (124-156)/(70-90) 124/70 (04/16 0306) SpO2:  [92 %-96 %] 96 % (04/16 0306) Weight:  [91 kg] 91 kg (04/16 0500)  Hemodynamic parameters for last 24 hours:    Intake/Output from previous day: 04/15 0701 - 04/16 0700 In: -  Out: 1775 [Urine:1775] Intake/Output this shift: No intake/output data recorded.  General appearance: alert, cooperative and no distress Heart: regular rate and rhythm, S1, S2 normal, no murmur, click, rub or gallop Lungs: clear to auscultation bilaterally Abdomen: soft, non-tender; bowel sounds normal; no masses,  no organomegaly Extremities: some edema in left arm (radial harvest) Wound: clean and dry  Lab Results: Recent Labs    07/11/20 0447 07/12/20 0257  WBC 12.5* 11.4*  HGB 12.2* 12.1*  HCT 34.9* 34.4*  PLT 154 164   BMET:  Recent Labs    07/11/20 0447 07/12/20 0257  NA 134* 132*  K 3.8 3.4*  CL 102 100  CO2 24 26  GLUCOSE 152* 132*  BUN 13 14  CREATININE 1.24 1.18  CALCIUM 8.4* 8.4*    PT/INR: No results for input(s): LABPROT, INR in the last 72 hours. ABG    Component Value Date/Time   PHART 7.328 (L) 07/09/2020 1811   HCO3 21.4 07/09/2020 1811   TCO2 23 07/09/2020 1811   ACIDBASEDEF 4.0 (H) 07/09/2020 1811   O2SAT 97.0 07/09/2020 1811   CBG (last 3)  Recent Labs    07/11/20 2344  07/12/20 0428 07/12/20 0800  GLUCAP 111* 126* 119*    Assessment/Plan: S/P Procedure(s) (LRB): CORONARY ARTERY BYPASS GRAFTING (CABG) X TWO USING LEFT INTERNAL MAMMARY ARTERY AND LEFT RADIAL ARTERY (N/A) RADIAL ARTERY HARVEST (Left) TRANSESOPHAGEAL ECHOCARDIOGRAM (TEE) (N/A)  1. CV - NSR in the 80s On Lopressor 25 mg bid and Amlodipine 5 mg daily. Will increase Lopressor to 37.5 mg bid. BP well controlled 2.  Pulmonary - On room air. Will check PA/LAT CXR in am. Encourage incentive spirometer 3. Volume Overload - On Lasix 40 mg daily 4.  Expected post op acute blood loss anemia - H and H stable at 12.1 and 34.4 5. CBGs stable. Pre op HGA1C 5.5. 6. LOC constipation-resolved  Plan: Discharge today, instructions explained to the patient.    LOS: 4 days    Sharlene Dory 07/13/2020

## 2020-07-13 NOTE — Progress Notes (Signed)
Mobility Specialist: Progress Note   07/13/20 1543  Mobility  Activity Off unit   Pt discharged.   [facility] Arvon Schreiner Mobility Specialist Mobility Specialist Phone: [phone]

## 2020-07-15 NOTE — Progress Notes (Signed)
Addendum:  07/13/20 18:15 Wife called to make aware that pharmacy had closed at 6 pm. Micah Flesher over all medications with wife and OK till morning with medications. Only medication not available was oxycodone which patient did not want anyway. Requested that wife call office for tramadol script to be sent to open pharmacy as pt was taking this medication prior to discharge. Wife to confer with husband and follow up with MD. Thomas Hoff, RN

## 2020-07-16 ENCOUNTER — Telehealth: Payer: Self-pay

## 2020-07-16 NOTE — Telephone Encounter (Signed)
Patient's wife contacted the office with medication questions s/p CABG x2 with Dr. Cliffton Asters 07/09/20. She stated that Brian Curry was unsure if he should be taking Lasix/ potassium and how much.  Advised that he should be taking 40 mg of Lasix daily and 20 MEQ of potassium daily and that they can be taken at the same time.  She acknowledged receipt.

## 2020-07-18 ENCOUNTER — Other Ambulatory Visit: Payer: Self-pay

## 2020-07-18 ENCOUNTER — Ambulatory Visit: Payer: Federal, State, Local not specified - PPO | Admitting: Cardiology

## 2020-07-18 ENCOUNTER — Encounter: Payer: Self-pay | Admitting: Cardiology

## 2020-07-18 VITALS — BP 118/70 | HR 70 | Temp 96.3°F | Resp 16 | Ht 67.0 in | Wt 192.0 lb

## 2020-07-18 DIAGNOSIS — E66811 Other obesity due to excess calories: Secondary | ICD-10-CM

## 2020-07-18 DIAGNOSIS — E782 Mixed hyperlipidemia: Secondary | ICD-10-CM

## 2020-07-18 DIAGNOSIS — I10 Essential (primary) hypertension: Secondary | ICD-10-CM

## 2020-07-18 DIAGNOSIS — E6609 Other obesity due to excess calories: Secondary | ICD-10-CM

## 2020-07-18 DIAGNOSIS — Z951 Presence of aortocoronary bypass graft: Secondary | ICD-10-CM | POA: Diagnosis not present

## 2020-07-18 DIAGNOSIS — Z6830 Body mass index (BMI) 30.0-30.9, adult: Secondary | ICD-10-CM

## 2020-07-18 DIAGNOSIS — I2510 Atherosclerotic heart disease of native coronary artery without angina pectoris: Secondary | ICD-10-CM | POA: Diagnosis not present

## 2020-07-18 DIAGNOSIS — Z8249 Family history of ischemic heart disease and other diseases of the circulatory system: Secondary | ICD-10-CM

## 2020-07-18 NOTE — Progress Notes (Signed)
Date:  07/18/2020   ID:  Brian Curry, DOB May 22, 1960, MRN [id number]  PCP:  Brian Dus, MD  Cardiologist:  Rex Kras, DO, [facility] (established care 06/06/2020) Former Cardiology Providers: NA Cardiothoracic surgeon: Dr. Kipp Brood  Date: 07/18/20 Last Office Visit: 06/27/2020  Chief Complaint  Patient presents with  . Follow-up    Status post bypass surgery    HPI  Brian Curry is a 60 y.o. African American  male who presents to the office with a chief complaint of " status post bypass surgery." Patient's past medical history and cardiovascular risk factors include: Multivessel CAD, status post two-vessel CABG (LIMA to the LAD, left radial to OM1), hypertension, anxiety, BPH, obesity due to excess calories, mixed hyperlipidemia, bilateral carpal tunnel,  He is referred to the office at the request of Brian Dus, MD for evaluation of family history of heart disease.  Patient's wife Brian Curry is present at today's encounter (cell [phone]).   Patient establish care with the practice in March 2022 for symptoms of precordial pain and family history of CAD.  Patient symptoms are very concerning for angina pectoris and therefore the shared decision was to proceed with coronary CTA with FFR.  Patient was noted to have distal left main disease and LAD/LCx disease.  He underwent left heart catheterization and then referred to cardiothoracic surgery for bypass evaluation.  Patient underwent two-vessel CABG by Dr. Kipp Brood on 07/09/2020.  Postoperative course overall unremarkable.  He now presents to the office for follow-up.  Patient was discharged to the hospital on July 13, 2020 and doing well postoperatively.  Patient denies any chest pain or shortness of breath.  Denies any fevers or chills or symptoms of infection.  His sternotomy site is healing well.  He has an appointment with Dr. Kipp Brood in May 2022 he has not started cardiac rehab yet.  Family hx of CAD mom and  dad. Dad had MI around age of 23 and Mom had MI around 59.   FUNCTIONAL STATUS: Walks 1-2x per week for about 3 miles at a time.    ALLERGIES: No Known Allergies  MEDICATION LIST PRIOR TO VISIT: Current Meds  Medication Sig  . acetaminophen (TYLENOL) 325 MG tablet   . amLODipine (NORVASC) 5 MG tablet Take 1 tablet (5 mg total) by mouth daily.  Marland Kitchen aspirin EC 325 MG EC tablet Take 1 tablet (325 mg total) by mouth daily.  Marland Kitchen atorvastatin (LIPITOR) 80 MG tablet Take 1 tablet (80 mg total) by mouth at bedtime. (Patient taking differently: Take 80 mg by mouth at bedtime.)  . famotidine (PEPCID) 40 MG tablet Take 40 mg by mouth daily.  . Metoprolol Tartrate 37.5 MG TABS Take 37.5 mg by mouth 2 (two) times daily.  Marland Kitchen omeprazole (PRILOSEC) 20 MG capsule Take 20 mg by mouth daily.  Marland Kitchen oxyCODONE (OXY IR/ROXICODONE) 5 MG immediate release tablet Take 1 tablet (5 mg total) by mouth every 6 (six) hours as needed for severe pain.  . tamsulosin (FLOMAX) 0.4 MG CAPS capsule Take 0.4 mg by mouth daily.  . TURMERIC-GINGER PO Take 2 tablets by mouth See admin instructions. Five times a week     PAST MEDICAL HISTORY: Past Medical History:  Diagnosis Date  . BPH (benign prostatic hyperplasia)   . Coronary artery disease   . GERD (gastroesophageal reflux disease)   . Hyperlipidemia   . Hypertension     PAST SURGICAL HISTORY: Past Surgical History:  Procedure Laterality Date  . COLONOSCOPY    .  CORONARY ARTERY BYPASS GRAFT N/A 07/09/2020   Procedure: CORONARY ARTERY BYPASS GRAFTING (CABG) X TWO USING LEFT INTERNAL MAMMARY ARTERY AND LEFT RADIAL ARTERY;  Surgeon: Brian Matte, MD;  Location: [place];  Service: Open Heart Surgery;  Laterality: N/A;  . HERNIA REPAIR    . LEFT HEART CATH AND CORONARY ANGIOGRAPHY N/A 07/02/2020   Procedure: LEFT HEART CATH AND CORONARY ANGIOGRAPHY;  Surgeon: Brian Mormon, MD;  Location: [place] [facility];  Service: Cardiovascular;  Laterality: N/A;  . RADIAL  ARTERY HARVEST Left 07/09/2020   Procedure: RADIAL ARTERY HARVEST;  Surgeon: Brian Matte, MD;  Location: [place];  Service: Open Heart Surgery;  Laterality: Left;  . TEE WITHOUT CARDIOVERSION N/A 07/09/2020   Procedure: TRANSESOPHAGEAL ECHOCARDIOGRAM (TEE);  Surgeon: Brian Matte, MD;  Location: [place];  Service: Open Heart Surgery;  Laterality: N/A;  . WISDOM TOOTH EXTRACTION      FAMILY HISTORY: The patient family history includes Heart attack in his father and mother; Hypertension in his father and mother; Stroke in his father.  SOCIAL HISTORY:  The patient  reports that he has never smoked. He has never used smokeless tobacco. He reports current alcohol use. He reports that he does not use drugs.  REVIEW OF SYSTEMS: Review of Systems  Constitutional: Negative for chills and fever.  HENT: Negative for hoarse voice and nosebleeds.   Eyes: Negative for discharge, double vision and pain.  Cardiovascular: Negative for chest pain, claudication, dyspnea on exertion, leg swelling, near-syncope, orthopnea, palpitations, paroxysmal nocturnal dyspnea and syncope.  Respiratory: Negative for hemoptysis and shortness of breath.   Musculoskeletal: Negative for muscle cramps and myalgias.  Gastrointestinal: Negative for abdominal pain, constipation, diarrhea, heartburn, hematemesis, hematochezia, melena, nausea and vomiting.  Neurological: Negative for dizziness and light-headedness.    PHYSICAL EXAM: Vitals with BMI 07/18/2020 07/13/2020 07/13/2020  Height '5\' 7"'  - -  Weight 192 lbs - -  BMI 10.96 - -  Systolic 045 409 811  Diastolic 70 80 80  Pulse 70 92 92   CONSTITUTIONAL: Well-developed and well-nourished. No acute distress.  SKIN: Skin is warm and dry. No rash noted. No cyanosis. No pallor. No jaundice HEAD: Normocephalic and atraumatic.  EYES: No scleral icterus MOUTH/THROAT: Moist oral membranes.  NECK: No JVD present. No thyromegaly noted. No carotid bruits  LYMPHATIC: No  visible cervical adenopathy.  CHEST Normal respiratory effort. No intercostal retractions.  Sternotomy site is clean dry and intact.  No erythema or drainage present. LUNGS: Clear to auscultation bilaterally.  No stridor. No wheezes. No rales.  CARDIOVASCULAR: Regular rate and rhythm, positive S1-S2, no murmurs rubs or gallops appreciated. ABDOMINAL: Soft, nontender, nondistended, positive bowel sounds in all 4 quadrants, no apparent ascites.  EXTREMITIES: No peripheral edema.  Left radial harvested site is clean dry and intact. HEMATOLOGIC: No significant bruising NEUROLOGIC: Oriented to person, place, and time. Nonfocal. Normal muscle tone.  PSYCHIATRIC: Normal mood and affect. Normal behavior. Cooperative  CARDIAC DATABASE: Coronary artery bypass grafting surgery: 07/09/2020: LIMA to the LAD, left radial to OM1  EKG: 07/18/2020: Normal sinus rhythm, 69 bpm, T wave inversions in V1-V2 consider anteroseptal ischemia, without underlying injury pattern.  Compared to prior EKG dated 07/13/2020 T wave inversions are new.  Echocardiogram: No results found for this or any previous visit from the past 1095 days.   Stress Testing: No results found for this or any previous visit from the past 1095 days.  Coronary CTA: 06/25/2020 1. Coronary calcium score of 584.  This was 97th percentile for age and sex matched control.  2. Normal coronary origin with right dominance.  3. CAD-RADS = 3. Mild stenosis (25-49%) at the distal left main due to mixed plaque. Moderate stenosis (50-69%) at the ostial/proximal LAD due to mixed plaque. Mild stenosis (25-49%) in the proximal LCx due to calcified plaque. Mild stenosis (25-49%) at the ostial OM1 due to mixed plaque. Mild stenosis (25-49%) in mid RCA due to calcified plaque.  4. Aortic atherosclerosis.  5. Study is sent for CT-FFR to further evaluate the LM and LAD disease. Findings will be performed and reported separately.  Heart  Catheterization: None  LABORATORY DATA: CBC Latest Ref Rng & Units 07/12/2020 07/11/2020 07/10/2020  WBC 4.0 - 10.5 K/uL 11.4(H) 12.5(H) 14.2(H)  Hemoglobin 13.0 - 17.0 g/dL 12.1(L) 12.2(L) 12.4(L)  Hematocrit 39.0 - 52.0 % 34.4(L) 34.9(L) 35.9(L)  Platelets 150 - 400 K/uL 164 154 155    CMP Latest Ref Rng & Units 07/12/2020 07/11/2020 07/10/2020  Glucose 70 - 99 mg/dL 132(H) 152(H) 152(H)  BUN 8 - 23 mg/dL '14 13 13  ' Creatinine 0.61 - 1.24 mg/dL 1.18 1.24 1.20  Sodium 135 - 145 mmol/L 132(L) 134(L) 131(L)  Potassium 3.5 - 5.1 mmol/L 3.4(L) 3.8 4.0  Chloride 98 - 111 mmol/L 100 102 100  CO2 22 - 32 mmol/L '26 24 24  ' Calcium 8.9 - 10.3 mg/dL 8.4(L) 8.4(L) 8.3(L)  Total Protein 6.5 - 8.1 g/dL - - -  Total Bilirubin 0.3 - 1.2 mg/dL - - -  Alkaline Phos 38 - 126 U/L - - -  AST 15 - 41 U/L - - -  ALT 0 - 44 U/L - - -    Lipid Panel     Component Value Date/Time   CHOL 143 06/28/2020 1001   TRIG 60 06/28/2020 1001   HDL 41 06/28/2020 1001   LDLCALC 90 06/28/2020 1001   LDLDIRECT 82 06/28/2020 1001   LABVLDL 12 06/28/2020 1001    No components found for: NTPROBNP No results for input(s): PROBNP in the last 8760 hours. No results for input(s): TSH in the last 8760 hours.  BMP Recent Labs    07/10/20 1700 07/11/20 0447 07/12/20 0257  NA 131* 134* 132*  K 4.0 3.8 3.4*  CL 100 102 100  CO2 '24 24 26  ' GLUCOSE 152* 152* 132*  BUN '13 13 14  ' CREATININE 1.20 1.24 1.18  CALCIUM 8.3* 8.4* 8.4*  GFRNONAA >60 >60 >60    HEMOGLOBIN A1C Lab Results  Component Value Date   HGBA1C 5.5 07/05/2020   MPG 111.15 07/05/2020    External Labs: Collected: 10/04/2018 Creatinine 1.23 mg/dL. eGFR: 73 mL/min per 1.73 m Hemoglobin 15.9 g/dL, hematocrit 46.5% AST 20, ALT 23, alkaline phosphatase 64 Lipid profile: Total cholesterol 134, triglycerides 50, HDL 41, LDL 84, non-HDL 94  IMPRESSION:    ICD-10-CM   1. Atherosclerosis of native coronary artery of native heart without angina  pectoris  I25.10 Pro b natriuretic peptide (BNP)    CMP14+EGFR    Lipid Panel With LDL/HDL Ratio    LDL cholesterol, direct    LDL cholesterol, direct    Lipid Panel With LDL/HDL Ratio    CMP14+EGFR    Pro b natriuretic peptide (BNP)  2. S/P CABG (coronary artery bypass graft)  Z95.1 Pro b natriuretic peptide (BNP)    CMP14+EGFR    Lipid Panel With LDL/HDL Ratio    LDL cholesterol, direct    LDL cholesterol, direct    Lipid  Panel With LDL/HDL Ratio    CMP14+EGFR    Pro b natriuretic peptide (BNP)  3. Benign hypertension  I10 EKG 12-Lead  4. Mixed hyperlipidemia  E78.2   5. Class 1 obesity due to excess calories without serious comorbidity with body mass index (BMI) of 30.0 to 30.9 in adult  E66.09    Z68.30   6. Family history of premature CAD  Z82.49      RECOMMENDATIONS: ARIC JOST is a 60 y.o. African American male whose past medical history and cardiac risk factors include:  Hypertension, anxiety, BPH, obesity due to excess calories, mixed hyperlipidemia, bilateral carpal tunnel.   Established coronary artery disease status post two-vessel CABG without angina pectoris: Progressing well status post two-vessel bypass surgery. Asymptomatic with regards to chest pain or anginal equivalent. No use of sublingual nitroglycerin tablets. EKG shows normal sinus rhythm with T wave inversions in the anterior precordial leads V1 and V2 suggestive of possible ischemia.  New findings compared to prior EKG.  Clinically patient is asymptomatic. Medications reconciled. Amlodipine due to left radial artery harvesting Discharge summary, postop report, hospital records reviewed. Patient has an upcoming appoint with Dr. Kipp Brood in May 2022 after that once he is cleared would recommend cardiac rehab. Currently on aspirin 325 mg p.o. daily.  Recommend discussing it further with Dr. Kipp Brood and may consider transitioning to 81 mg p.o. daily.  Will defer to CT surgery secondary to recent  bypass. Would like to see him back in 3 months labs prior to the visit to further uptitrate guideline directed medical therapy if needed and to improve his cardiovascular risk factors.  Benign essential hypertension: Office and home blood pressures are very well controlled. Medications reconciled. Educated on importance of a low-salt diet. Continue to monitor  Hyperlipidemia, mixed: Continue statin therapy. Does not endorse myalgias. Will check fasting lipid profile prior to the next office visit.  Family history of premature CAD: Continue lifestyle modifications, work-up as discussed above.  FINAL MEDICATION LIST END OF ENCOUNTER: No orders of the defined types were placed in this encounter.   Medications Discontinued During This Encounter  Medication Reason  . furosemide (LASIX) 40 MG tablet Error  . potassium chloride SA (KLOR-CON) 20 MEQ tablet Error     Current Outpatient Medications:  .  acetaminophen (TYLENOL) 325 MG tablet, , Disp: , Rfl:  .  amLODipine (NORVASC) 5 MG tablet, Take 1 tablet (5 mg total) by mouth daily., Disp: 30 tablet, Rfl: 1 .  aspirin EC 325 MG EC tablet, Take 1 tablet (325 mg total) by mouth daily., Disp: 30 tablet, Rfl: 0 .  atorvastatin (LIPITOR) 80 MG tablet, Take 1 tablet (80 mg total) by mouth at bedtime. (Patient taking differently: Take 80 mg by mouth at bedtime.), Disp: 90 tablet, Rfl: 0 .  famotidine (PEPCID) 40 MG tablet, Take 40 mg by mouth daily., Disp: , Rfl:  .  Metoprolol Tartrate 37.5 MG TABS, Take 37.5 mg by mouth 2 (two) times daily., Disp: 60 tablet, Rfl: 1 .  omeprazole (PRILOSEC) 20 MG capsule, Take 20 mg by mouth daily., Disp: , Rfl:  .  oxyCODONE (OXY IR/ROXICODONE) 5 MG immediate release tablet, Take 1 tablet (5 mg total) by mouth every 6 (six) hours as needed for severe pain., Disp: 30 tablet, Rfl: 0 .  tamsulosin (FLOMAX) 0.4 MG CAPS capsule, Take 0.4 mg by mouth daily., Disp: , Rfl:  .  TURMERIC-GINGER PO, Take 2 tablets by  mouth See admin instructions. Five times  a week, Disp: , Rfl:   Orders Placed This Encounter  Procedures  . Pro b natriuretic peptide (BNP)  . CMP14+EGFR  . Lipid Panel With LDL/HDL Ratio  . LDL cholesterol, direct  . EKG 12-Lead    There are no Patient Instructions on file for this visit.   --Continue cardiac medications as reconciled in final medication list. --Return in about 3 months (around 10/17/2020) for Follow up, CAD, s/p CABG. Or sooner if needed. --Continue follow-up with your primary care physician regarding the management of your other chronic comorbid conditions.  Patient's questions and concerns were addressed to his satisfaction. He voices understanding of the instructions provided during this encounter.   This note was created using a voice recognition software as a result there may be grammatical errors inadvertently enclosed that do not reflect the nature of this encounter. Every attempt is made to correct such errors.  Rex Kras, [place], [facility]  Pager: [phone] Office: [phone]

## 2020-07-26 DIAGNOSIS — R7989 Other specified abnormal findings of blood chemistry: Secondary | ICD-10-CM | POA: Diagnosis not present

## 2020-07-26 DIAGNOSIS — E782 Mixed hyperlipidemia: Secondary | ICD-10-CM | POA: Diagnosis not present

## 2020-07-26 DIAGNOSIS — J019 Acute sinusitis, unspecified: Secondary | ICD-10-CM | POA: Diagnosis not present

## 2020-07-26 DIAGNOSIS — Z951 Presence of aortocoronary bypass graft: Secondary | ICD-10-CM | POA: Diagnosis not present

## 2020-07-31 DIAGNOSIS — I10 Essential (primary) hypertension: Secondary | ICD-10-CM | POA: Diagnosis not present

## 2020-07-31 DIAGNOSIS — K219 Gastro-esophageal reflux disease without esophagitis: Secondary | ICD-10-CM | POA: Diagnosis not present

## 2020-07-31 DIAGNOSIS — E785 Hyperlipidemia, unspecified: Secondary | ICD-10-CM | POA: Diagnosis not present

## 2020-07-31 DIAGNOSIS — J90 Pleural effusion, not elsewhere classified: Secondary | ICD-10-CM | POA: Diagnosis not present

## 2020-07-31 DIAGNOSIS — R109 Unspecified abdominal pain: Secondary | ICD-10-CM | POA: Diagnosis not present

## 2020-07-31 DIAGNOSIS — K829 Disease of gallbladder, unspecified: Secondary | ICD-10-CM | POA: Diagnosis not present

## 2020-07-31 DIAGNOSIS — K259 Gastric ulcer, unspecified as acute or chronic, without hemorrhage or perforation: Secondary | ICD-10-CM | POA: Diagnosis not present

## 2020-07-31 DIAGNOSIS — R791 Abnormal coagulation profile: Secondary | ICD-10-CM | POA: Diagnosis not present

## 2020-07-31 DIAGNOSIS — Z79899 Other long term (current) drug therapy: Secondary | ICD-10-CM | POA: Diagnosis not present

## 2020-07-31 DIAGNOSIS — Z951 Presence of aortocoronary bypass graft: Secondary | ICD-10-CM | POA: Diagnosis not present

## 2020-07-31 DIAGNOSIS — N400 Enlarged prostate without lower urinary tract symptoms: Secondary | ICD-10-CM | POA: Diagnosis not present

## 2020-07-31 DIAGNOSIS — E6609 Other obesity due to excess calories: Secondary | ICD-10-CM | POA: Diagnosis not present

## 2020-07-31 DIAGNOSIS — R918 Other nonspecific abnormal finding of lung field: Secondary | ICD-10-CM | POA: Diagnosis not present

## 2020-07-31 DIAGNOSIS — I2510 Atherosclerotic heart disease of native coronary artery without angina pectoris: Secondary | ICD-10-CM | POA: Diagnosis not present

## 2020-07-31 DIAGNOSIS — F419 Anxiety disorder, unspecified: Secondary | ICD-10-CM | POA: Diagnosis not present

## 2020-07-31 DIAGNOSIS — Z7982 Long term (current) use of aspirin: Secondary | ICD-10-CM | POA: Diagnosis not present

## 2020-07-31 DIAGNOSIS — R1013 Epigastric pain: Secondary | ICD-10-CM | POA: Diagnosis not present

## 2020-07-31 DIAGNOSIS — R1010 Upper abdominal pain, unspecified: Secondary | ICD-10-CM | POA: Diagnosis not present

## 2020-07-31 DIAGNOSIS — R9431 Abnormal electrocardiogram [ECG] [EKG]: Secondary | ICD-10-CM | POA: Diagnosis not present

## 2020-07-31 DIAGNOSIS — R7401 Elevation of levels of liver transaminase levels: Secondary | ICD-10-CM | POA: Diagnosis not present

## 2020-07-31 DIAGNOSIS — R0781 Pleurodynia: Secondary | ICD-10-CM | POA: Diagnosis not present

## 2020-08-02 ENCOUNTER — Other Ambulatory Visit: Payer: Self-pay

## 2020-08-02 ENCOUNTER — Ambulatory Visit (INDEPENDENT_AMBULATORY_CARE_PROVIDER_SITE_OTHER): Payer: Self-pay | Admitting: Physician Assistant

## 2020-08-02 VITALS — BP 122/79 | HR 74 | Resp 20 | Ht 67.0 in | Wt 187.0 lb

## 2020-08-02 DIAGNOSIS — Z951 Presence of aortocoronary bypass graft: Secondary | ICD-10-CM

## 2020-08-02 DIAGNOSIS — I2510 Atherosclerotic heart disease of native coronary artery without angina pectoris: Secondary | ICD-10-CM

## 2020-08-02 NOTE — Progress Notes (Signed)
  HPI: Patient returns for routine postoperative follow-up having undergone CABG x 2  on 07/09/2020.  The patient's early postoperative recovery while in the hospital was as expected without complication.  Since hospital discharge the patient reports he is doing well.  He is ambulating without difficulty 2-3 miles per day.  His surgical incisions are healing without evidence of infection.  He does continue to have some numbness from his radial artery site.  His weight has been stable.  His appetite has also improved.  He is interested in participating with cardiac rehab and they ask if referral can be sent to [facility]. He does ask if he can have a dental cleaning done and he was told this would be fine.  Current Outpatient Medications  Medication Sig Dispense Refill  . acetaminophen (TYLENOL) 325 MG tablet     . amLODipine (NORVASC) 5 MG tablet Take 1 tablet (5 mg total) by mouth daily. 30 tablet 1  . aspirin EC 325 MG EC tablet Take 1 tablet (325 mg total) by mouth daily. 30 tablet 0  . atorvastatin (LIPITOR) 80 MG tablet Take 1 tablet (80 mg total) by mouth at bedtime. (Patient taking differently: Take 80 mg by mouth at bedtime.) 90 tablet 0  . benzonatate (TESSALON) 100 MG capsule Take by mouth 3 (three) times daily as needed for cough.    . Metoprolol Tartrate 37.5 MG TABS Take 37.5 mg by mouth 2 (two) times daily. 60 tablet 1  . omeprazole (PRILOSEC) 20 MG capsule Take 20 mg by mouth daily.    Marland Kitchen oxyCODONE (OXY IR/ROXICODONE) 5 MG immediate release tablet Take 1 tablet (5 mg total) by mouth every 6 (six) hours as needed for severe pain. 30 tablet 0  . tamsulosin (FLOMAX) 0.4 MG CAPS capsule Take 0.4 mg by mouth daily.    . TURMERIC-GINGER PO Take 2 tablets by mouth See admin instructions. Five times a week    . famotidine (PEPCID) 40 MG tablet Take 40 mg by mouth daily. (Patient not taking: Reported on 08/02/2020)     No current facility-administered medications for this visit.    Physical  Exam:  BP 122/79   Pulse 74   Resp 20   Ht 5\' 7"  (1.702 m)   Wt 187 lb (84.8 kg)   SpO2 100% Comment: RA  BMI 29.29 kg/m   Gen: no apparent distress Heart: RRR Lungs: CTA bilaterally Ext: no edema present Incisions: clean and dry, healing w/o evidence of infection  Diagnostic Tests:  None performed  A/P:  1. S/P CABG x 2 utilizing radial artery... he is doing well.  He remains on Norvasc for his radial artery.  His blood pressure is well controlled and we can continue this medicine.  However, should he develop hypotension this can be discontinued after 5/12 2. Cardiac rehab- okay to start, will send referral to [facility] per patients request 3. Activity- okay to increase as tolerated, sternal precautions for another 2 weeks, okay to resume driving. 4. RTC prn  [facility], PA-C [facility] [phone]

## 2020-08-05 ENCOUNTER — Other Ambulatory Visit: Payer: Self-pay | Admitting: *Deleted

## 2020-08-05 NOTE — Progress Notes (Signed)
Outpatient referral for Cardiac Rehab faxed to [facility] at [phone].

## 2020-08-23 ENCOUNTER — Encounter: Payer: Self-pay | Admitting: Cardiology

## 2020-09-10 DIAGNOSIS — D62 Acute posthemorrhagic anemia: Secondary | ICD-10-CM | POA: Diagnosis not present

## 2020-09-10 DIAGNOSIS — E782 Mixed hyperlipidemia: Secondary | ICD-10-CM | POA: Diagnosis not present

## 2020-09-10 DIAGNOSIS — I10 Essential (primary) hypertension: Secondary | ICD-10-CM | POA: Diagnosis not present

## 2020-09-10 DIAGNOSIS — I2510 Atherosclerotic heart disease of native coronary artery without angina pectoris: Secondary | ICD-10-CM | POA: Diagnosis not present

## 2020-09-10 DIAGNOSIS — Z125 Encounter for screening for malignant neoplasm of prostate: Secondary | ICD-10-CM | POA: Diagnosis not present

## 2020-09-10 DIAGNOSIS — Z79899 Other long term (current) drug therapy: Secondary | ICD-10-CM | POA: Diagnosis not present

## 2020-09-10 DIAGNOSIS — R7303 Prediabetes: Secondary | ICD-10-CM | POA: Diagnosis not present

## 2020-09-21 ENCOUNTER — Other Ambulatory Visit: Payer: Self-pay | Admitting: Cardiology

## 2020-09-21 ENCOUNTER — Other Ambulatory Visit: Payer: Self-pay | Admitting: Physician Assistant

## 2020-09-21 DIAGNOSIS — I208 Other forms of angina pectoris: Secondary | ICD-10-CM

## 2020-09-22 NOTE — Progress Notes (Signed)
External Labs: Collected: 09/11/2020 provided by PCP Hemoglobin 14.2 g/dL. Hematocrit 43%. Creatinine 1.22 mg/dL. eGFR: 67 mL/min per 1.73 m Sodium 140, potassium 4.5, chloride 104, bicarb 30 ALT 17 AST 19, alkaline phosphatase 102 (within normal limits) Lipid profile: Total cholesterol 124, triglycerides 56, HDL 35, LDL 77, non-HDL 89 Hemoglobin A1c: 5.5  Please inform the patient that the labs have been reviewed. Hemoglobin A1c within normal limits. Lipid profile within acceptable range.  Would still like the LDL less than 70 mg/dL.  Please focus on lifestyle changes (i.e. diet) continue current medical therapy.

## 2020-09-23 NOTE — Progress Notes (Signed)
Called pt to inform him about his lab results,. Pt understood

## 2020-09-24 ENCOUNTER — Other Ambulatory Visit: Payer: Self-pay | Admitting: Physician Assistant

## 2020-09-25 ENCOUNTER — Other Ambulatory Visit: Payer: Self-pay

## 2020-09-25 MED ORDER — METOPROLOL TARTRATE 37.5 MG PO TABS: 37.5000 mg | ORAL_TABLET | Freq: Two times a day (BID) | ORAL | 1 refills | Status: DC

## 2020-10-04 DIAGNOSIS — Z951 Presence of aortocoronary bypass graft: Secondary | ICD-10-CM | POA: Diagnosis not present

## 2020-10-04 DIAGNOSIS — Z5189 Encounter for other specified aftercare: Secondary | ICD-10-CM | POA: Diagnosis not present

## 2020-10-04 DIAGNOSIS — I2510 Atherosclerotic heart disease of native coronary artery without angina pectoris: Secondary | ICD-10-CM | POA: Diagnosis not present

## 2020-10-07 DIAGNOSIS — I2510 Atherosclerotic heart disease of native coronary artery without angina pectoris: Secondary | ICD-10-CM | POA: Diagnosis not present

## 2020-10-07 DIAGNOSIS — Z951 Presence of aortocoronary bypass graft: Secondary | ICD-10-CM | POA: Diagnosis not present

## 2020-10-07 DIAGNOSIS — Z5189 Encounter for other specified aftercare: Secondary | ICD-10-CM | POA: Diagnosis not present

## 2020-10-09 DIAGNOSIS — I2510 Atherosclerotic heart disease of native coronary artery without angina pectoris: Secondary | ICD-10-CM | POA: Diagnosis not present

## 2020-10-09 DIAGNOSIS — Z951 Presence of aortocoronary bypass graft: Secondary | ICD-10-CM | POA: Diagnosis not present

## 2020-10-09 DIAGNOSIS — Z5189 Encounter for other specified aftercare: Secondary | ICD-10-CM | POA: Diagnosis not present

## 2020-10-11 DIAGNOSIS — Z951 Presence of aortocoronary bypass graft: Secondary | ICD-10-CM | POA: Diagnosis not present

## 2020-10-11 DIAGNOSIS — I2510 Atherosclerotic heart disease of native coronary artery without angina pectoris: Secondary | ICD-10-CM | POA: Diagnosis not present

## 2020-10-11 DIAGNOSIS — Z5189 Encounter for other specified aftercare: Secondary | ICD-10-CM | POA: Diagnosis not present

## 2020-10-14 DIAGNOSIS — Z951 Presence of aortocoronary bypass graft: Secondary | ICD-10-CM | POA: Diagnosis not present

## 2020-10-14 DIAGNOSIS — I2510 Atherosclerotic heart disease of native coronary artery without angina pectoris: Secondary | ICD-10-CM | POA: Diagnosis not present

## 2020-10-14 DIAGNOSIS — Z5189 Encounter for other specified aftercare: Secondary | ICD-10-CM | POA: Diagnosis not present

## 2020-10-16 DIAGNOSIS — I2510 Atherosclerotic heart disease of native coronary artery without angina pectoris: Secondary | ICD-10-CM | POA: Diagnosis not present

## 2020-10-16 DIAGNOSIS — Z951 Presence of aortocoronary bypass graft: Secondary | ICD-10-CM | POA: Diagnosis not present

## 2020-10-16 DIAGNOSIS — Z5189 Encounter for other specified aftercare: Secondary | ICD-10-CM | POA: Diagnosis not present

## 2020-10-17 ENCOUNTER — Other Ambulatory Visit: Payer: Self-pay

## 2020-10-17 ENCOUNTER — Ambulatory Visit: Payer: Federal, State, Local not specified - PPO | Admitting: Cardiology

## 2020-10-17 ENCOUNTER — Encounter: Payer: Self-pay | Admitting: Cardiology

## 2020-10-17 VITALS — BP 114/80 | HR 65 | Resp 16 | Ht 67.0 in | Wt 183.8 lb

## 2020-10-17 DIAGNOSIS — E66811 Other obesity due to excess calories: Secondary | ICD-10-CM

## 2020-10-17 DIAGNOSIS — Z8249 Family history of ischemic heart disease and other diseases of the circulatory system: Secondary | ICD-10-CM

## 2020-10-17 DIAGNOSIS — E782 Mixed hyperlipidemia: Secondary | ICD-10-CM | POA: Diagnosis not present

## 2020-10-17 DIAGNOSIS — Z951 Presence of aortocoronary bypass graft: Secondary | ICD-10-CM | POA: Diagnosis not present

## 2020-10-17 DIAGNOSIS — K219 Gastro-esophageal reflux disease without esophagitis: Secondary | ICD-10-CM

## 2020-10-17 DIAGNOSIS — I2510 Atherosclerotic heart disease of native coronary artery without angina pectoris: Secondary | ICD-10-CM

## 2020-10-17 DIAGNOSIS — E6609 Other obesity due to excess calories: Secondary | ICD-10-CM

## 2020-10-17 DIAGNOSIS — I10 Essential (primary) hypertension: Secondary | ICD-10-CM | POA: Diagnosis not present

## 2020-10-17 DIAGNOSIS — Z6830 Body mass index (BMI) 30.0-30.9, adult: Secondary | ICD-10-CM

## 2020-10-17 MED ORDER — METOPROLOL TARTRATE 37.5 MG PO TABS: 37.5000 mg | ORAL_TABLET | Freq: Two times a day (BID) | ORAL | 1 refills | Status: DC

## 2020-10-17 MED ORDER — ASPIRIN EC 81 MG PO TBEC: 81.0000 mg | DELAYED_RELEASE_TABLET | Freq: Every day | ORAL | 0 refills | Status: DC

## 2020-10-17 MED ORDER — OMEPRAZOLE 20 MG PO CPDR: 20.0000 mg | DELAYED_RELEASE_CAPSULE | Freq: Every day | ORAL | 0 refills | Status: AC | PRN

## 2020-10-17 MED ORDER — CLOPIDOGREL BISULFATE 75 MG PO TABS: 75.0000 mg | ORAL_TABLET | Freq: Every day | ORAL | 0 refills | Status: DC

## 2020-10-17 NOTE — Progress Notes (Signed)
ID:  Brian Curry, DOB 10/09/1960, MRN [id number]  PCP:  Maury Dus, MD  Cardiologist:  Rex Kras, DO, [facility] (established care 06/06/2020) Former Cardiology Providers: NA Cardiothoracic surgeon: Dr. Kipp Brood  Date: 10/17/20 Last Office Visit: 07/18/2020  Chief Complaint  Patient presents with   Atherosclerosis of native coronary artery of native heart w   Follow-up    HPI  Brian Curry is a 60 y.o. African American  male who presents to the office with a chief complaint of " CAD management." Patient's past medical history and cardiovascular risk factors include: Multivessel CAD, status post two-vessel CABG (LIMA to the LAD, left radial to OM1), hypertension, anxiety, BPH, obesity due to excess calories, mixed hyperlipidemia, bilateral carpal tunnel,  He is referred to the office at the request of Maury Dus, MD for evaluation of family history of heart disease.  Patient's wife Brian Curry is present at today's encounter (cell [phone]).   Establish with the office back in March 2022 for symptoms of precordial pain, heartburn-like symptoms, and family history of CAD.  Given his symptoms that shared decision was to proceed with cardiac CTA which noted multivessel CAD.  Patient subsequently underwent invasive angiography which confirmed the findings and was referred to cardiothoracic surgery for bypass evaluation.  On 07/09/2020 patient underwent two-vessel bypass with Dr. Kipp Brood and postoperatively has done well.  Patient now presents for 24-monthfollow-up.  Patient denies any chest pain or anginal equivalents.  He is overall euvolemic and not in congestive heart failure.  He is walking at least 6 miles a day and has improved his diet which has facilitated at least 9 pounds of weight loss.  He has started cardiac rehab approximately 2 weeks ago and has 10 more weeks remaining.  Patient had labs performed at outside facility which were reviewed and noted below for  reference.  Family hx of CAD mom and dad. Dad had MI around age of 89and Mom had MI around 637   FUNCTIONAL STATUS: Walks 6 miles a day on a daily basis.   ALLERGIES: No Known Allergies  MEDICATION LIST PRIOR TO VISIT: Current Meds  Medication Sig   amLODipine (NORVASC) 5 MG tablet Take 1 tablet (5 mg total) by mouth daily.   aspirin EC 81 MG tablet Take 1 tablet (81 mg total) by mouth daily. Swallow whole.   atorvastatin (LIPITOR) 80 MG tablet TAKE 1 TABLET BY MOUTH AT BEDTIME   clopidogrel (PLAVIX) 75 MG tablet Take 1 tablet (75 mg total) by mouth daily.   tamsulosin (FLOMAX) 0.4 MG CAPS capsule Take 0.4 mg by mouth daily.   [DISCONTINUED] aspirin EC 325 MG EC tablet Take 1 tablet (325 mg total) by mouth daily.   [DISCONTINUED] Metoprolol Tartrate 37.5 MG TABS Take 37.5 mg by mouth 2 (two) times daily.   [DISCONTINUED] omeprazole (PRILOSEC) 20 MG capsule Take 20 mg by mouth daily.     PAST MEDICAL HISTORY: Past Medical History:  Diagnosis Date   BPH (benign prostatic hyperplasia)    Coronary artery disease    GERD (gastroesophageal reflux disease)    Hyperlipidemia    Hypertension     PAST SURGICAL HISTORY: Past Surgical History:  Procedure Laterality Date   COLONOSCOPY     CORONARY ARTERY BYPASS GRAFT N/A 07/09/2020   Procedure: CORONARY ARTERY BYPASS GRAFTING (CABG) X TWO USING LEFT INTERNAL MAMMARY ARTERY AND LEFT RADIAL ARTERY;  Surgeon: LLajuana Matte MD;  [place] [facility]  Service: Open Heart Surgery;  Laterality: N/A;   HERNIA REPAIR     LEFT HEART CATH AND CORONARY ANGIOGRAPHY N/A 07/02/2020   Procedure: LEFT HEART CATH AND CORONARY ANGIOGRAPHY;  Surgeon: Nigel Mormon, MD;  Location: [place] [facility];  Service: Cardiovascular;  Laterality: N/A;   RADIAL ARTERY HARVEST Left 07/09/2020   Procedure: RADIAL ARTERY HARVEST;  Surgeon: Lajuana Matte, MD;  Location: [place];  Service: Open Heart Surgery;  Laterality: Left;   TEE WITHOUT CARDIOVERSION  N/A 07/09/2020   Procedure: TRANSESOPHAGEAL ECHOCARDIOGRAM (TEE);  Surgeon: Lajuana Matte, MD;  Location: [place];  Service: Open Heart Surgery;  Laterality: N/A;   WISDOM TOOTH EXTRACTION      FAMILY HISTORY: The patient family history includes Heart attack in his father and mother; Hypertension in his father and mother; Stroke in his father.  SOCIAL HISTORY:  The patient  reports that he has never smoked. He has never used smokeless tobacco. He reports current alcohol use. He reports that he does not use drugs.  REVIEW OF SYSTEMS: Review of Systems  Constitutional: Negative for chills and fever.  HENT:  Negative for hoarse voice and nosebleeds.   Eyes:  Negative for discharge, double vision and pain.  Cardiovascular:  Negative for chest pain, claudication, dyspnea on exertion, leg swelling, near-syncope, orthopnea, palpitations, paroxysmal nocturnal dyspnea and syncope.  Respiratory:  Negative for hemoptysis and shortness of breath.   Musculoskeletal:  Negative for muscle cramps and myalgias.  Gastrointestinal:  Negative for abdominal pain, constipation, diarrhea, heartburn, hematemesis, hematochezia, melena, nausea and vomiting.  Neurological:  Negative for dizziness and light-headedness.   PHYSICAL EXAM: Vitals with BMI 10/17/2020 08/02/2020 07/18/2020  Height '5\' 7"'  '5\' 7"'  '5\' 7"'   Weight 183 lbs 13 oz 187 lbs 192 lbs  BMI 28.78 51.02 58.52  Systolic 778 242 353  Diastolic 80 79 70  Pulse 65 74 70   CONSTITUTIONAL: Well-developed and well-nourished. No acute distress.  SKIN: Skin is warm and dry. No rash noted. No cyanosis. No pallor. No jaundice HEAD: Normocephalic and atraumatic.  EYES: No scleral icterus MOUTH/THROAT: Moist oral membranes.  NECK: No JVD present. No thyromegaly noted. No carotid bruits  LYMPHATIC: No visible cervical adenopathy.  CHEST Normal respiratory effort. No intercostal retractions.  Sternotomy site is clean dry and intact.  No erythema or drainage  present. LUNGS: Clear to auscultation bilaterally.  No stridor. No wheezes. No rales.  CARDIOVASCULAR: Regular rate and rhythm, positive S1-S2, no murmurs rubs or gallops appreciated. ABDOMINAL: Soft, nontender, nondistended, positive bowel sounds in all 4 quadrants, no apparent ascites.  EXTREMITIES: No peripheral edema.  Left radial harvested site is clean dry and intact. HEMATOLOGIC: No significant bruising NEUROLOGIC: Oriented to person, place, and time. Nonfocal. Normal muscle tone.  PSYCHIATRIC: Normal mood and affect. Normal behavior. Cooperative  CARDIAC DATABASE: Coronary artery bypass grafting surgery: 07/09/2020: LIMA to the LAD, left radial to OM1  EKG: 07/18/2020: Normal sinus rhythm, 69 bpm, T wave inversions in V1-V2 consider anteroseptal ischemia, without underlying injury pattern.  Compared to prior EKG dated 07/13/2020 T wave inversions are new.  Echocardiogram: No results found for this or any previous visit from the past 1095 days.   Stress Testing: No results found for this or any previous visit from the past 1095 days.  Coronary CTA: 06/25/2020 1. Coronary calcium score of 584. This was 97th percentile for age and sex matched control.  2. Normal coronary origin with right dominance. 3. CAD-RADS = 3. Mild stenosis (25-49%) at the distal left  main due to mixed plaque. Moderate stenosis (50-69%) at the ostial/proximal LAD due to mixed plaque. Mild stenosis (25-49%) in the proximal LCx due to calcified plaque. Mild stenosis (25-49%) at the ostial OM1 due to mixed plaque. Mild stenosis (25-49%) in mid RCA due to calcified plaque.  4. Aortic atherosclerosis. 5. Study is sent for CT-FFR to further evaluate the LM and LAD disease. Findings will be performed and reported separately.  Heart Catheterization: 07/02/2020 LM: Distal 90% stenosis LAD: Ostial-prox 95-90% tandem stenoses LCx: Prox LCx 50% stenosis RCA: Mid 20% stenosis   Excellent surgical targets in LAD/LCx    LVEDP normal  Patient has had chronic stable angina, currently asymptomatic and HD stable. I have consulted CVTS for urgent outpatient consultation. Cautioned the patient to avoid any strenuous physical activity.    LABORATORY DATA: CBC Latest Ref Rng & Units 07/12/2020 07/11/2020 07/10/2020  WBC 4.0 - 10.5 K/uL 11.4(H) 12.5(H) 14.2(H)  Hemoglobin 13.0 - 17.0 g/dL 12.1(L) 12.2(L) 12.4(L)  Hematocrit 39.0 - 52.0 % 34.4(L) 34.9(L) 35.9(L)  Platelets 150 - 400 K/uL 164 154 155    CMP Latest Ref Rng & Units 07/12/2020 07/11/2020 07/10/2020  Glucose 70 - 99 mg/dL 132(H) 152(H) 152(H)  BUN 8 - 23 mg/dL '14 13 13  ' Creatinine 0.61 - 1.24 mg/dL 1.18 1.24 1.20  Sodium 135 - 145 mmol/L 132(L) 134(L) 131(L)  Potassium 3.5 - 5.1 mmol/L 3.4(L) 3.8 4.0  Chloride 98 - 111 mmol/L 100 102 100  CO2 22 - 32 mmol/L '26 24 24  ' Calcium 8.9 - 10.3 mg/dL 8.4(L) 8.4(L) 8.3(L)  Total Protein 6.5 - 8.1 g/dL - - -  Total Bilirubin 0.3 - 1.2 mg/dL - - -  Alkaline Phos 38 - 126 U/L - - -  AST 15 - 41 U/L - - -  ALT 0 - 44 U/L - - -    Lipid Panel     Component Value Date/Time   CHOL 143 06/28/2020 1001   TRIG 60 06/28/2020 1001   HDL 41 06/28/2020 1001   LDLCALC 90 06/28/2020 1001   LDLDIRECT 82 06/28/2020 1001   LABVLDL 12 06/28/2020 1001    No components found for: NTPROBNP No results for input(s): PROBNP in the last 8760 hours. No results for input(s): TSH in the last 8760 hours.  BMP Recent Labs    07/10/20 1700 07/11/20 0447 07/12/20 0257  NA 131* 134* 132*  K 4.0 3.8 3.4*  CL 100 102 100  CO2 '24 24 26  ' GLUCOSE 152* 152* 132*  BUN '13 13 14  ' CREATININE 1.20 1.24 1.18  CALCIUM 8.3* 8.4* 8.4*  GFRNONAA >60 >60 >60    HEMOGLOBIN A1C Lab Results  Component Value Date   HGBA1C 5.5 07/05/2020   MPG 111.15 07/05/2020    External Labs: Collected: 10/04/2018 Creatinine 1.23 mg/dL. eGFR: 73 mL/min per 1.73 m Hemoglobin 15.9 g/dL, hematocrit 46.5% AST 20, ALT 23, alkaline phosphatase  64 Lipid profile: Total cholesterol 134, triglycerides 50, HDL 41, LDL 84, non-HDL 94  External Labs: Collected: 09/11/2020 provided by PCP Hemoglobin 14.2 g/dL. Hematocrit 43%. Creatinine 1.22 mg/dL. eGFR: 67 mL/min per 1.73 m Sodium 140, potassium 4.5, chloride 104, bicarb 30 ALT 17 AST 19, alkaline phosphatase 102 (within normal limits) Lipid profile: Total cholesterol 124, triglycerides 56, HDL 35, LDL 77, non-HDL 89 Hemoglobin A1c: 5.5  IMPRESSION:    ICD-10-CM   1. Atherosclerosis of native coronary artery of native heart without angina pectoris  I25.10 aspirin EC 81 MG tablet  clopidogrel (PLAVIX) 75 MG tablet    Metoprolol Tartrate 37.5 MG TABS    2. S/P CABG (coronary artery bypass graft)  Z95.1 aspirin EC 81 MG tablet    clopidogrel (PLAVIX) 75 MG tablet    Metoprolol Tartrate 37.5 MG TABS    3. Benign hypertension  I10     4. Mixed hyperlipidemia  E78.2     5. Class 1 obesity due to excess calories without serious comorbidity with body mass index (BMI) of 30.0 to 30.9 in adult  E66.09    Z68.30     6. Family history of premature CAD  Z82.49     7. Gastroesophageal reflux disease, unspecified whether esophagitis present  K21.9 omeprazole (PRILOSEC) 20 MG capsule       RECOMMENDATIONS: GERAN HAITHCOCK is a 60 y.o. African American male whose past medical history and cardiac risk factors include:  Hypertension, anxiety, BPH, obesity due to excess calories, mixed hyperlipidemia, bilateral carpal tunnel.   Established coronary artery disease status post two-vessel CABG without angina pectoris: Progressing well status post two-vessel bypass surgery. Asymptomatic with regards to chest pain or anginal equivalent. No use of sublingual nitroglycerin tablets. Medications reconciled. Amlodipine due to left radial artery harvesting Discontinue aspirin 325 mg p.o. daily. We will transition him to aspirin 81 mg p.o. daily and Plavix 75 mg p.o. daily. Patient is  currently in cardiac rehab. He continues to implement lifestyle changes and has facilitated 9 pounds of weight loss over the last 3 months. Metoprolol refilled.  Benign essential hypertension: Office and home blood pressures are very well controlled. Medications reconciled. Educated on importance of a low-salt diet. Continue to monitor  Hyperlipidemia, mixed: Continue statin therapy. Does not endorse myalgias. Outside labs independently reviewed which included a lipid profile.  Patient is LDL is 77 mg/dL.  At this time commended continued lifestyle changes and recommend a goal LDL of less than 70 mg/dL. Will recheck fasting lipid profile prior to the next office visit.  Patient states that he has a PCP appointment prior to January 2022 and will bring in the labs.  Family history of premature CAD: Continue lifestyle modifications, work-up as discussed above.  Patient also requested refills on Prilosec.  I recommended a as needed use of Prilosec as needed for heartburn symptoms.  However if the symptoms continue he is asked to follow-up with his PCP for possible GI evaluation we will leave further work-up to his PCP's discretion.  Patient verbalized understanding.  FINAL MEDICATION LIST END OF ENCOUNTER: Meds ordered this encounter  Medications   aspirin EC 81 MG tablet    Sig: Take 1 tablet (81 mg total) by mouth daily. Swallow whole.    Dispense:  90 tablet    Refill:  0   clopidogrel (PLAVIX) 75 MG tablet    Sig: Take 1 tablet (75 mg total) by mouth daily.    Dispense:  90 tablet    Refill:  0   Metoprolol Tartrate 37.5 MG TABS    Sig: Take 37.5 mg by mouth 2 (two) times daily.    Dispense:  60 tablet    Refill:  1   omeprazole (PRILOSEC) 20 MG capsule    Sig: Take 1 capsule (20 mg total) by mouth daily as needed for up to 30 doses.    Dispense:  30 capsule    Refill:  0    Medications Discontinued During This Encounter  Medication Reason   benzonatate (TESSALON) 100 MG  capsule Error  oxyCODONE (OXY IR/ROXICODONE) 5 MG immediate release tablet Error   TURMERIC-GINGER PO Error   acetaminophen (TYLENOL) 325 MG tablet Error   aspirin EC 325 MG EC tablet Change in therapy   omeprazole (PRILOSEC) 20 MG capsule Reorder   Metoprolol Tartrate 37.5 MG TABS Reorder     Current Outpatient Medications:    amLODipine (NORVASC) 5 MG tablet, Take 1 tablet (5 mg total) by mouth daily., Disp: 30 tablet, Rfl: 1   aspirin EC 81 MG tablet, Take 1 tablet (81 mg total) by mouth daily. Swallow whole., Disp: 90 tablet, Rfl: 0   atorvastatin (LIPITOR) 80 MG tablet, TAKE 1 TABLET BY MOUTH AT BEDTIME, Disp: 90 tablet, Rfl: 0   clopidogrel (PLAVIX) 75 MG tablet, Take 1 tablet (75 mg total) by mouth daily., Disp: 90 tablet, Rfl: 0   tamsulosin (FLOMAX) 0.4 MG CAPS capsule, Take 0.4 mg by mouth daily., Disp: , Rfl:    Metoprolol Tartrate 37.5 MG TABS, Take 37.5 mg by mouth 2 (two) times daily., Disp: 60 tablet, Rfl: 1   omeprazole (PRILOSEC) 20 MG capsule, Take 1 capsule (20 mg total) by mouth daily as needed for up to 30 doses., Disp: 30 capsule, Rfl: 0  No orders of the defined types were placed in this encounter.   There are no Patient Instructions on file for this visit.   --Continue cardiac medications as reconciled in final medication list. --Return in about 6 months (around 04/19/2021) for Follow up, CAD. Or sooner if needed. --Continue follow-up with your primary care physician regarding the management of your other chronic comorbid conditions.  Patient's questions and concerns were addressed to his satisfaction. He voices understanding of the instructions provided during this encounter.   This note was created using a voice recognition software as a result there may be grammatical errors inadvertently enclosed that do not reflect the nature of this encounter. Every attempt is made to correct such errors.  Rex Kras, [place], [facility]  Pager: [phone] Office:  [phone]

## 2020-10-18 DIAGNOSIS — Z951 Presence of aortocoronary bypass graft: Secondary | ICD-10-CM | POA: Diagnosis not present

## 2020-10-18 DIAGNOSIS — I2510 Atherosclerotic heart disease of native coronary artery without angina pectoris: Secondary | ICD-10-CM | POA: Diagnosis not present

## 2020-10-18 DIAGNOSIS — Z5189 Encounter for other specified aftercare: Secondary | ICD-10-CM | POA: Diagnosis not present

## 2020-10-21 DIAGNOSIS — Z951 Presence of aortocoronary bypass graft: Secondary | ICD-10-CM | POA: Diagnosis not present

## 2020-10-21 DIAGNOSIS — Z5189 Encounter for other specified aftercare: Secondary | ICD-10-CM | POA: Diagnosis not present

## 2020-10-21 DIAGNOSIS — I2510 Atherosclerotic heart disease of native coronary artery without angina pectoris: Secondary | ICD-10-CM | POA: Diagnosis not present

## 2020-10-22 ENCOUNTER — Other Ambulatory Visit: Payer: Self-pay

## 2020-10-22 DIAGNOSIS — I2510 Atherosclerotic heart disease of native coronary artery without angina pectoris: Secondary | ICD-10-CM

## 2020-10-22 DIAGNOSIS — Z951 Presence of aortocoronary bypass graft: Secondary | ICD-10-CM

## 2020-10-22 MED ORDER — METOPROLOL TARTRATE 37.5 MG PO TABS: 37.5000 mg | ORAL_TABLET | Freq: Two times a day (BID) | ORAL | 3 refills | Status: DC

## 2020-10-23 DIAGNOSIS — Z951 Presence of aortocoronary bypass graft: Secondary | ICD-10-CM | POA: Diagnosis not present

## 2020-10-23 DIAGNOSIS — Z5189 Encounter for other specified aftercare: Secondary | ICD-10-CM | POA: Diagnosis not present

## 2020-10-23 DIAGNOSIS — I2510 Atherosclerotic heart disease of native coronary artery without angina pectoris: Secondary | ICD-10-CM | POA: Diagnosis not present

## 2020-10-25 DIAGNOSIS — Z5189 Encounter for other specified aftercare: Secondary | ICD-10-CM | POA: Diagnosis not present

## 2020-10-25 DIAGNOSIS — I2510 Atherosclerotic heart disease of native coronary artery without angina pectoris: Secondary | ICD-10-CM | POA: Diagnosis not present

## 2020-10-25 DIAGNOSIS — Z951 Presence of aortocoronary bypass graft: Secondary | ICD-10-CM | POA: Diagnosis not present

## 2020-10-28 DIAGNOSIS — Z5189 Encounter for other specified aftercare: Secondary | ICD-10-CM | POA: Diagnosis not present

## 2020-10-28 DIAGNOSIS — I2510 Atherosclerotic heart disease of native coronary artery without angina pectoris: Secondary | ICD-10-CM | POA: Diagnosis not present

## 2020-10-28 DIAGNOSIS — Z951 Presence of aortocoronary bypass graft: Secondary | ICD-10-CM | POA: Diagnosis not present

## 2020-10-30 DIAGNOSIS — I2510 Atherosclerotic heart disease of native coronary artery without angina pectoris: Secondary | ICD-10-CM | POA: Diagnosis not present

## 2020-10-30 DIAGNOSIS — Z5189 Encounter for other specified aftercare: Secondary | ICD-10-CM | POA: Diagnosis not present

## 2020-10-30 DIAGNOSIS — Z951 Presence of aortocoronary bypass graft: Secondary | ICD-10-CM | POA: Diagnosis not present

## 2020-11-01 DIAGNOSIS — Z5189 Encounter for other specified aftercare: Secondary | ICD-10-CM | POA: Diagnosis not present

## 2020-11-01 DIAGNOSIS — I2510 Atherosclerotic heart disease of native coronary artery without angina pectoris: Secondary | ICD-10-CM | POA: Diagnosis not present

## 2020-11-01 DIAGNOSIS — Z951 Presence of aortocoronary bypass graft: Secondary | ICD-10-CM | POA: Diagnosis not present

## 2020-11-04 DIAGNOSIS — I2510 Atherosclerotic heart disease of native coronary artery without angina pectoris: Secondary | ICD-10-CM | POA: Diagnosis not present

## 2020-11-04 DIAGNOSIS — Z5189 Encounter for other specified aftercare: Secondary | ICD-10-CM | POA: Diagnosis not present

## 2020-11-04 DIAGNOSIS — Z951 Presence of aortocoronary bypass graft: Secondary | ICD-10-CM | POA: Diagnosis not present

## 2020-11-05 DIAGNOSIS — R0789 Other chest pain: Secondary | ICD-10-CM | POA: Diagnosis not present

## 2020-11-06 DIAGNOSIS — I2510 Atherosclerotic heart disease of native coronary artery without angina pectoris: Secondary | ICD-10-CM | POA: Diagnosis not present

## 2020-11-06 DIAGNOSIS — Z951 Presence of aortocoronary bypass graft: Secondary | ICD-10-CM | POA: Diagnosis not present

## 2020-11-06 DIAGNOSIS — Z5189 Encounter for other specified aftercare: Secondary | ICD-10-CM | POA: Diagnosis not present

## 2020-11-11 DIAGNOSIS — Z5189 Encounter for other specified aftercare: Secondary | ICD-10-CM | POA: Diagnosis not present

## 2020-11-11 DIAGNOSIS — Z951 Presence of aortocoronary bypass graft: Secondary | ICD-10-CM | POA: Diagnosis not present

## 2020-11-11 DIAGNOSIS — I2510 Atherosclerotic heart disease of native coronary artery without angina pectoris: Secondary | ICD-10-CM | POA: Diagnosis not present

## 2020-11-21 ENCOUNTER — Telehealth: Payer: Self-pay | Admitting: Student

## 2020-11-21 NOTE — Telephone Encounter (Signed)
ON-CALL CARDIOLOGY 11/19/2020  Patient's name: Brian Curry.   MRN: [id number].    DOB: 25-Dec-1960 Primary care provider: Elias Else, MD. Primary cardiologist: Dr Odis Hollingshead  Interaction regarding this patient's care today: Patient's wife called stating she and the patient have been experiencing 3 days of congestion, cough, and fatigue. They were recently exposed to a friend who had an upper respiratory infection (tested negative for Covid 19). Patient is requesting advice on how to treat symptoms.     Recommendations: Patient's symptoms appear consistent with upper respiratory infection. Advised patient follow up with PCP for evaluation and recommendations. Also recommend patient consider testing for Covid 19.   Telephone encounter total time: 4 minutes     Rayford Halsted, PA-C 11/21/2020, 12:01 PM Office: [phone]

## 2020-11-27 DIAGNOSIS — Z951 Presence of aortocoronary bypass graft: Secondary | ICD-10-CM | POA: Diagnosis not present

## 2020-11-27 DIAGNOSIS — Z5189 Encounter for other specified aftercare: Secondary | ICD-10-CM | POA: Diagnosis not present

## 2020-11-27 DIAGNOSIS — I2510 Atherosclerotic heart disease of native coronary artery without angina pectoris: Secondary | ICD-10-CM | POA: Diagnosis not present

## 2020-11-29 DIAGNOSIS — Z5189 Encounter for other specified aftercare: Secondary | ICD-10-CM | POA: Diagnosis not present

## 2020-11-29 DIAGNOSIS — I2510 Atherosclerotic heart disease of native coronary artery without angina pectoris: Secondary | ICD-10-CM | POA: Diagnosis not present

## 2020-11-29 DIAGNOSIS — Z951 Presence of aortocoronary bypass graft: Secondary | ICD-10-CM | POA: Diagnosis not present

## 2020-12-06 DIAGNOSIS — Z5189 Encounter for other specified aftercare: Secondary | ICD-10-CM | POA: Diagnosis not present

## 2020-12-06 DIAGNOSIS — Z951 Presence of aortocoronary bypass graft: Secondary | ICD-10-CM | POA: Diagnosis not present

## 2020-12-06 DIAGNOSIS — I2510 Atherosclerotic heart disease of native coronary artery without angina pectoris: Secondary | ICD-10-CM | POA: Diagnosis not present

## 2020-12-09 DIAGNOSIS — I2510 Atherosclerotic heart disease of native coronary artery without angina pectoris: Secondary | ICD-10-CM | POA: Diagnosis not present

## 2020-12-09 DIAGNOSIS — Z5189 Encounter for other specified aftercare: Secondary | ICD-10-CM | POA: Diagnosis not present

## 2020-12-09 DIAGNOSIS — Z951 Presence of aortocoronary bypass graft: Secondary | ICD-10-CM | POA: Diagnosis not present

## 2020-12-16 ENCOUNTER — Other Ambulatory Visit: Payer: Self-pay | Admitting: Cardiology

## 2020-12-16 DIAGNOSIS — I2510 Atherosclerotic heart disease of native coronary artery without angina pectoris: Secondary | ICD-10-CM | POA: Diagnosis not present

## 2020-12-16 DIAGNOSIS — Z5189 Encounter for other specified aftercare: Secondary | ICD-10-CM | POA: Diagnosis not present

## 2020-12-16 DIAGNOSIS — I208 Other forms of angina pectoris: Secondary | ICD-10-CM

## 2020-12-16 DIAGNOSIS — Z951 Presence of aortocoronary bypass graft: Secondary | ICD-10-CM | POA: Diagnosis not present

## 2020-12-18 DIAGNOSIS — Z5189 Encounter for other specified aftercare: Secondary | ICD-10-CM | POA: Diagnosis not present

## 2020-12-18 DIAGNOSIS — I2510 Atherosclerotic heart disease of native coronary artery without angina pectoris: Secondary | ICD-10-CM | POA: Diagnosis not present

## 2020-12-18 DIAGNOSIS — Z951 Presence of aortocoronary bypass graft: Secondary | ICD-10-CM | POA: Diagnosis not present

## 2020-12-20 DIAGNOSIS — Z951 Presence of aortocoronary bypass graft: Secondary | ICD-10-CM | POA: Diagnosis not present

## 2020-12-20 DIAGNOSIS — Z5189 Encounter for other specified aftercare: Secondary | ICD-10-CM | POA: Diagnosis not present

## 2020-12-20 DIAGNOSIS — I2510 Atherosclerotic heart disease of native coronary artery without angina pectoris: Secondary | ICD-10-CM | POA: Diagnosis not present

## 2020-12-23 DIAGNOSIS — Z5189 Encounter for other specified aftercare: Secondary | ICD-10-CM | POA: Diagnosis not present

## 2020-12-23 DIAGNOSIS — I2510 Atherosclerotic heart disease of native coronary artery without angina pectoris: Secondary | ICD-10-CM | POA: Diagnosis not present

## 2020-12-23 DIAGNOSIS — Z951 Presence of aortocoronary bypass graft: Secondary | ICD-10-CM | POA: Diagnosis not present

## 2020-12-25 DIAGNOSIS — Z5189 Encounter for other specified aftercare: Secondary | ICD-10-CM | POA: Diagnosis not present

## 2020-12-25 DIAGNOSIS — I2510 Atherosclerotic heart disease of native coronary artery without angina pectoris: Secondary | ICD-10-CM | POA: Diagnosis not present

## 2020-12-25 DIAGNOSIS — Z951 Presence of aortocoronary bypass graft: Secondary | ICD-10-CM | POA: Diagnosis not present

## 2020-12-27 DIAGNOSIS — I2510 Atherosclerotic heart disease of native coronary artery without angina pectoris: Secondary | ICD-10-CM | POA: Diagnosis not present

## 2020-12-27 DIAGNOSIS — Z951 Presence of aortocoronary bypass graft: Secondary | ICD-10-CM | POA: Diagnosis not present

## 2020-12-27 DIAGNOSIS — Z5189 Encounter for other specified aftercare: Secondary | ICD-10-CM | POA: Diagnosis not present

## 2020-12-30 DIAGNOSIS — Z5189 Encounter for other specified aftercare: Secondary | ICD-10-CM | POA: Diagnosis not present

## 2020-12-30 DIAGNOSIS — Z951 Presence of aortocoronary bypass graft: Secondary | ICD-10-CM | POA: Diagnosis not present

## 2020-12-30 DIAGNOSIS — I2510 Atherosclerotic heart disease of native coronary artery without angina pectoris: Secondary | ICD-10-CM | POA: Diagnosis not present

## 2020-12-31 ENCOUNTER — Telehealth: Payer: Self-pay

## 2020-12-31 ENCOUNTER — Other Ambulatory Visit: Payer: Self-pay | Admitting: Cardiology

## 2020-12-31 DIAGNOSIS — I2510 Atherosclerotic heart disease of native coronary artery without angina pectoris: Secondary | ICD-10-CM

## 2020-12-31 DIAGNOSIS — Z951 Presence of aortocoronary bypass graft: Secondary | ICD-10-CM

## 2020-12-31 NOTE — Telephone Encounter (Signed)
Shary Key, please provide the patient return back to work note starting 01/07/2021.

## 2021-01-01 DIAGNOSIS — I2510 Atherosclerotic heart disease of native coronary artery without angina pectoris: Secondary | ICD-10-CM | POA: Diagnosis not present

## 2021-01-01 DIAGNOSIS — Z5189 Encounter for other specified aftercare: Secondary | ICD-10-CM | POA: Diagnosis not present

## 2021-01-01 DIAGNOSIS — Z951 Presence of aortocoronary bypass graft: Secondary | ICD-10-CM | POA: Diagnosis not present

## 2021-01-01 NOTE — Telephone Encounter (Signed)
Doctor note has been done and I spoke to patient's wife Pieter Partridge and she requested for note to be faxed to [phone]

## 2021-01-03 DIAGNOSIS — I2510 Atherosclerotic heart disease of native coronary artery without angina pectoris: Secondary | ICD-10-CM | POA: Diagnosis not present

## 2021-01-03 DIAGNOSIS — Z5189 Encounter for other specified aftercare: Secondary | ICD-10-CM | POA: Diagnosis not present

## 2021-01-03 DIAGNOSIS — Z951 Presence of aortocoronary bypass graft: Secondary | ICD-10-CM | POA: Diagnosis not present

## 2021-01-21 DIAGNOSIS — H35033 Hypertensive retinopathy, bilateral: Secondary | ICD-10-CM | POA: Diagnosis not present

## 2021-01-21 DIAGNOSIS — H40013 Open angle with borderline findings, low risk, bilateral: Secondary | ICD-10-CM | POA: Diagnosis not present

## 2021-02-26 DIAGNOSIS — Z1211 Encounter for screening for malignant neoplasm of colon: Secondary | ICD-10-CM | POA: Diagnosis not present

## 2021-02-28 DIAGNOSIS — I10 Essential (primary) hypertension: Secondary | ICD-10-CM | POA: Diagnosis not present

## 2021-02-28 DIAGNOSIS — R7303 Prediabetes: Secondary | ICD-10-CM | POA: Diagnosis not present

## 2021-02-28 DIAGNOSIS — E782 Mixed hyperlipidemia: Secondary | ICD-10-CM | POA: Diagnosis not present

## 2021-02-28 DIAGNOSIS — Z23 Encounter for immunization: Secondary | ICD-10-CM | POA: Diagnosis not present

## 2021-02-28 DIAGNOSIS — N400 Enlarged prostate without lower urinary tract symptoms: Secondary | ICD-10-CM | POA: Diagnosis not present

## 2021-02-28 DIAGNOSIS — Z0000 Encounter for general adult medical examination without abnormal findings: Secondary | ICD-10-CM | POA: Diagnosis not present

## 2021-03-26 ENCOUNTER — Other Ambulatory Visit: Payer: Self-pay | Admitting: Cardiology

## 2021-03-26 DIAGNOSIS — Z951 Presence of aortocoronary bypass graft: Secondary | ICD-10-CM

## 2021-03-26 DIAGNOSIS — I2510 Atherosclerotic heart disease of native coronary artery without angina pectoris: Secondary | ICD-10-CM

## 2021-04-18 ENCOUNTER — Other Ambulatory Visit: Payer: Self-pay | Admitting: Cardiology

## 2021-04-18 DIAGNOSIS — Z951 Presence of aortocoronary bypass graft: Secondary | ICD-10-CM

## 2021-04-18 DIAGNOSIS — I2510 Atherosclerotic heart disease of native coronary artery without angina pectoris: Secondary | ICD-10-CM

## 2021-04-21 ENCOUNTER — Ambulatory Visit: Payer: Federal, State, Local not specified - PPO | Admitting: Cardiology

## 2021-04-21 ENCOUNTER — Other Ambulatory Visit: Payer: Self-pay

## 2021-04-21 ENCOUNTER — Encounter: Payer: Self-pay | Admitting: Cardiology

## 2021-04-21 VITALS — BP 125/74 | HR 61 | Temp 97.8°F | Resp 16 | Ht 67.0 in | Wt 179.8 lb

## 2021-04-21 DIAGNOSIS — I2510 Atherosclerotic heart disease of native coronary artery without angina pectoris: Secondary | ICD-10-CM | POA: Diagnosis not present

## 2021-04-21 DIAGNOSIS — E782 Mixed hyperlipidemia: Secondary | ICD-10-CM | POA: Diagnosis not present

## 2021-04-21 DIAGNOSIS — Z951 Presence of aortocoronary bypass graft: Secondary | ICD-10-CM | POA: Diagnosis not present

## 2021-04-21 DIAGNOSIS — I10 Essential (primary) hypertension: Secondary | ICD-10-CM | POA: Diagnosis not present

## 2021-04-21 DIAGNOSIS — Z8249 Family history of ischemic heart disease and other diseases of the circulatory system: Secondary | ICD-10-CM

## 2021-04-21 MED ORDER — METOPROLOL SUCCINATE ER 50 MG PO TB24: 50.0000 mg | ORAL_TABLET | Freq: Every morning | ORAL | 0 refills | Status: DC

## 2021-04-21 NOTE — Progress Notes (Signed)
ID:  Brian Curry, DOB 11/11/1960, MRN [id number]  PCP:  Maury Dus, MD  Cardiologist:  Rex Kras, DO, [facility] (established care 06/06/2020) Former Cardiology Providers: NA Cardiothoracic surgeon: Dr. Kipp Brood  Date: 04/21/21 Last Office Visit: 10/17/2020  Chief Complaint  Patient presents with   Atherosclerosis of native coronary artery of native heart w   Coronary Artery Disease   Follow-up    6 months    HPI  Brian Curry is a 61 y.o. African American  male who presents to the office with a chief complaint of " 47-monthfollow-up for CAD management." Patient's past medical history and cardiovascular risk factors include: Multivessel CAD, status post two-vessel CABG (LIMA to the LAD, left radial to OM1), hypertension, anxiety, BPH, obesity due to excess calories, mixed hyperlipidemia, bilateral carpal tunnel.  He is referred to the office at the request of RMaury Dus MD for evaluation of family history of heart disease.  Establish with the office back in March 2022 for symptoms of precordial pain, heartburn-like symptoms, and family history of CAD.  Given his symptoms that shared decision was to proceed with cardiac CTA which noted multivessel CAD.  Patient subsequently underwent invasive angiography which confirmed the findings and was referred to cardiothoracic surgery for bypass evaluation. On 07/09/2020 patient underwent two-vessel bypass with Dr. LKipp Brood  and postoperatively has done well.  Patient now presents for 333-monthollow-up.  Patient presents for his 6-5-monthllow-up visit.  He remains asymptomatic from a cardiovascular standpoint.  He continues to lose weight and has lost additional 4 pounds since last visit.  He denies angina pectoris or heart failure symptoms.  His overall functional status remains relatively stable.  Family hx of CAD mom and dad. Dad had MI around age of 55 16d Mom had MI around 60.44 FUNCTIONAL STATUS: Walks 8,000-10,000  steps daily and 3 miles several days a week.   ALLERGIES: No Known Allergies  MEDICATION LIST PRIOR TO VISIT: Current Meds  Medication Sig   amLODipine (NORVASC) 5 MG tablet Take 1 tablet (5 mg total) by mouth daily.   atorvastatin (LIPITOR) 80 MG tablet TAKE 1 TABLET BY MOUTH AT BEDTIME   clopidogrel (PLAVIX) 75 MG tablet Take 1 tablet by mouth once daily   EQ ASPIRIN ADULT LOW DOSE 81 MG EC tablet TAKE 1 TABLET BY MOUTH ONCE DAILY, SWALLOW WHOLE   metoprolol succinate (TOPROL XL) 50 MG 24 hr tablet Take 1 tablet (50 mg total) by mouth every morning.   omeprazole (PRILOSEC) 20 MG capsule Take 1 capsule (20 mg total) by mouth daily as needed for up to 30 doses.   tamsulosin (FLOMAX) 0.4 MG CAPS capsule Take 0.4 mg by mouth daily.   [DISCONTINUED] Metoprolol Tartrate 37.5 MG TABS Take 37.5 mg by mouth 2 (two) times daily.     PAST MEDICAL HISTORY: Past Medical History:  Diagnosis Date   BPH (benign prostatic hyperplasia)    Coronary artery disease    GERD (gastroesophageal reflux disease)    Hyperlipidemia    Hypertension     PAST SURGICAL HISTORY: Past Surgical History:  Procedure Laterality Date   COLONOSCOPY     CORONARY ARTERY BYPASS GRAFT N/A 07/09/2020   Procedure: CORONARY ARTERY BYPASS GRAFTING (CABG) X TWO USING LEFT INTERNAL MAMMARY ARTERY AND LEFT RADIAL ARTERY;  Surgeon: LigLajuana MatteD;  [facility];  Laterality: N/A;   HERNIA REPAIR     LEFT HEART CATH AND  CORONARY ANGIOGRAPHY N/A 07/02/2020   Procedure: LEFT HEART CATH AND CORONARY ANGIOGRAPHY;  Surgeon: Nigel Mormon, MD;  Location: [facility];  Service: Cardiovascular;  Laterality: N/A;   RADIAL ARTERY HARVEST Left 07/09/2020   Procedure: RADIAL ARTERY HARVEST;  Surgeon: Lajuana Matte, MD;  Location: [facility];  Service: Open Heart Surgery;  Laterality: Left;   TEE WITHOUT CARDIOVERSION N/A 07/09/2020   Procedure: TRANSESOPHAGEAL ECHOCARDIOGRAM (TEE);   Surgeon: Lajuana Matte, MD;  Location: [facility];  Service: Open Heart Surgery;  Laterality: N/A;   WISDOM TOOTH EXTRACTION      FAMILY HISTORY: The patient family history includes Heart attack in his father and mother; Hypertension in his father and mother; Stroke in his father.  SOCIAL HISTORY:  The patient  reports that he has never smoked. He has never used smokeless tobacco. He reports current alcohol use. He reports that he does not use drugs.  REVIEW OF SYSTEMS: Review of Systems  Constitutional: Negative for chills and fever.  HENT:  Negative for hoarse voice and nosebleeds.   Eyes:  Negative for discharge, double vision and pain.  Cardiovascular:  Negative for chest pain, claudication, dyspnea on exertion, leg swelling, near-syncope, orthopnea, palpitations, paroxysmal nocturnal dyspnea and syncope.  Respiratory:  Negative for hemoptysis and shortness of breath.   Musculoskeletal:  Negative for muscle cramps and myalgias.  Gastrointestinal:  Negative for abdominal pain, constipation, diarrhea, heartburn, hematemesis, hematochezia, melena, nausea and vomiting.  Neurological:  Negative for dizziness and light-headedness.   PHYSICAL EXAM: Vitals with BMI 04/21/2021 10/17/2020 08/02/2020  Height _0  _1  _2   Weight 179 lbs 13 oz 183 lbs 13 oz 187 lbs  BMI 28.15 28.41 32.44  Systolic 010 272 536  Diastolic 74 80 79  Pulse 61 65 74   CONSTITUTIONAL: Well-developed and well-nourished. No acute distress.  SKIN: Skin is warm and dry. No rash noted. No cyanosis. No pallor. No jaundice HEAD: Normocephalic and atraumatic.  EYES: No scleral icterus MOUTH/THROAT: Moist oral membranes.  NECK: No JVD present. No thyromegaly noted. No carotid bruits  LYMPHATIC: No visible cervical adenopathy.  CHEST Normal respiratory effort. No intercostal retractions.  Sternotomy site is clean dry and intact.  No erythema or drainage present. LUNGS: Clear to auscultation bilaterally.  No stridor.  No wheezes. No rales.  CARDIOVASCULAR: Regular rate and rhythm, positive S1-S2, no murmurs rubs or gallops appreciated. ABDOMINAL: Soft, nontender, nondistended, positive bowel sounds in all 4 quadrants, no apparent ascites.  EXTREMITIES: No peripheral edema.  Left radial harvested site is clean dry and intact. HEMATOLOGIC: No significant bruising NEUROLOGIC: Oriented to person, place, and time. Nonfocal. Normal muscle tone.  PSYCHIATRIC: Normal mood and affect. Normal behavior. Cooperative  CARDIAC DATABASE: Coronary artery bypass grafting surgery: 07/09/2020: LIMA to the LAD, left radial to OM1  EKG: 04/21/2021: Sinus bradycardia, 48 bpm, normal axis, without underlying injury pattern.  Echocardiogram: 06/11/2020:  Left ventricle cavity is normal in size. Mild concentric hypertrophy of the left ventricle. Normal global wall motion. Normal LV systolic function with EF 63%. Doppler evidence of grade I (impaired) diastolic dysfunction, normal LAP.  No significant valvular abnormality.  Normal right atrial pressure.   Stress Testing: No results found for this or any previous visit from the past 1095 days.  Coronary CTA: 06/25/2020 1. Coronary calcium score of 584. This was 97th percentile for age and sex matched control.  2. Normal coronary origin with right dominance. 3. CAD-RADS = 3. Mild stenosis (25-49%) at the  distal left main due to mixed plaque. Moderate stenosis (50-69%) at the ostial/proximal LAD due to mixed plaque. Mild stenosis (25-49%) in the proximal LCx due to calcified plaque. Mild stenosis (25-49%) at the ostial OM1 due to mixed plaque. Mild stenosis (25-49%) in mid RCA due to calcified plaque.  4. Aortic atherosclerosis. 5. Study is sent for CT-FFR to further evaluate the LM and LAD disease. Findings will be performed and reported separately.  Heart Catheterization: 07/02/2020 LM: Distal 90% stenosis LAD: Ostial-prox 95-90% tandem stenoses LCx: Prox LCx 50%  stenosis RCA: Mid 20% stenosis   Excellent surgical targets in LAD/LCx   LVEDP normal  Patient has had chronic stable angina, currently asymptomatic and HD stable. I have consulted CVTS for urgent outpatient consultation. Cautioned the patient to avoid any strenuous physical activity.    LABORATORY DATA: CBC Latest Ref Rng & Units 07/12/2020 07/11/2020 07/10/2020  WBC 4.0 - 10.5 K/uL 11.4(H) 12.5(H) 14.2(H)  Hemoglobin 13.0 - 17.0 g/dL 12.1(L) 12.2(L) 12.4(L)  Hematocrit 39.0 - 52.0 % 34.4(L) 34.9(L) 35.9(L)  Platelets 150 - 400 K/uL 164 154 155    CMP Latest Ref Rng & Units 07/12/2020 07/11/2020 07/10/2020  Glucose 70 - 99 mg/dL 132(H) 152(H) 152(H)  BUN 8 - 23 mg/dL _0 Creatinine 0.61 - 1.24 mg/dL 1.18 1.24 1.20  Sodium 135 - 145 mmol/L 132(L) 134(L) 131(L)  Potassium 3.5 - 5.1 mmol/L 3.4(L) 3.8 4.0  Chloride 98 - 111 mmol/L 100 102 100  CO2 22 - 32 mmol/L _1 Calcium 8.9 - 10.3 mg/dL 8.4(L) 8.4(L) 8.3(L)  Total Protein 6.5 - 8.1 g/dL - - -  Total Bilirubin 0.3 - 1.2 mg/dL - - -  Alkaline Phos 38 - 126 U/L - - -  AST 15 - 41 U/L - - -  ALT 0 - 44 U/L - - -    Lipid Panel     Component Value Date/Time   CHOL 143 06/28/2020 1001   TRIG 60 06/28/2020 1001   HDL 41 06/28/2020 1001   LDLCALC 90 06/28/2020 1001   LDLDIRECT 82 06/28/2020 1001   LABVLDL 12 06/28/2020 1001    No components found for: NTPROBNP No results for input(s): PROBNP in the last 8760 hours. No results for input(s): TSH in the last 8760 hours.  BMP Recent Labs    07/10/20 1700 07/11/20 0447 07/12/20 0257  NA 131* 134* 132*  K 4.0 3.8 3.4*  CL 100 102 100  CO2 _2 GLUCOSE 152* 152* 132*  BUN _3 CREATININE 1.20 1.24 1.18  CALCIUM 8.3* 8.4* 8.4*  GFRNONAA >60 >60 >60    HEMOGLOBIN A1C Lab Results  Component Value Date   HGBA1C 5.5 07/05/2020   MPG 111.15 07/05/2020    External Labs: Collected: 10/04/2018 Creatinine 1.23 mg/dL. eGFR: 73 mL/min per 1.73  m Hemoglobin 15.9 g/dL, hematocrit 46.5% AST 20, ALT 23, alkaline phosphatase 64 Lipid profile: Total cholesterol 134, triglycerides 50, HDL 41, LDL 84, non-HDL 94  External Labs: Collected: 09/11/2020 provided by PCP Hemoglobin 14.2 g/dL. Hematocrit 43%. Creatinine 1.22 mg/dL. eGFR: 67 mL/min per 1.73 m Sodium 140, potassium 4.5, chloride 104, bicarb 30 ALT 17 AST 19, alkaline phosphatase 102 (within normal limits) Lipid profile: Total cholesterol 124, triglycerides 56, HDL 35, LDL 77, non-HDL 89 Hemoglobin A1c: 5.5  External Labs: Collected: 02/28/2021 available in Care Everywhere through [facility] Hemoglobin A1c 5.4. Hemoglobin 15.4 g/dL, hematocrit 44.4%. BUN 13, creatinine 1.08 mg/dL. Sodium  139, potassium 4.1, chloride 102, bicarb 30. Alkaline phosphatase 96, AST 24, ALT 21. Total cholesterol 141, HDL 44, triglycerides 48, LDL 87  IMPRESSION:    ICD-10-CM   1. Atherosclerosis of native coronary artery of native heart without angina pectoris  I25.10 metoprolol succinate (TOPROL XL) 50 MG 24 hr tablet    2. S/P CABG (coronary artery bypass graft)  Z95.1 metoprolol succinate (TOPROL XL) 50 MG 24 hr tablet    3. Benign hypertension  I10 EKG 12-Lead    4. Mixed hyperlipidemia  E78.2     5. Family history of premature CAD  Z82.49        RECOMMENDATIONS: MYKAEL BATZ is a 61 y.o. African American male whose past medical history and cardiac risk factors include:  Hypertension, anxiety, BPH, obesity due to excess calories, mixed hyperlipidemia, bilateral carpal tunnel.   Established coronary artery disease status post two-vessel CABG without angina pectoris: Status post two-vessel bypass surgery. Asymptomatic with regards to chest pain or anginal equivalent. No use of sublingual nitroglycerin tablets. Medications reconciled. Amlodipine due to left radial artery harvesting EKG shows sinus bradycardia without underlying injury pattern. Will reduce  his Lopressor to 37.5 mg p.o. twice daily to Toprol-XL 50 mg p.o. daily. Outside labs from care everywhere independently reviewed and noted above. LDL slightly above goal.  Patient plans to be more cognizant with his dietary intake of cholesterol rich foods.  Benign essential hypertension: Office and home blood pressures are very well controlled. Medications reconciled. Educated on importance of a low-salt diet. Continue to monitor  Hyperlipidemia, mixed: Continue statin therapy. Does not endorse myalgias. Outside labs independently reviewed and noted above.  Family history of premature CAD: Continue lifestyle modifications, work-up as discussed above.  FINAL MEDICATION LIST END OF ENCOUNTER: Meds ordered this encounter  Medications   metoprolol succinate (TOPROL XL) 50 MG 24 hr tablet    Sig: Take 1 tablet (50 mg total) by mouth every morning.    Dispense:  90 tablet    Refill:  0    Medications Discontinued During This Encounter  Medication Reason   Metoprolol Tartrate 37.5 MG TABS Dose change      Current Outpatient Medications:    amLODipine (NORVASC) 5 MG tablet, Take 1 tablet (5 mg total) by mouth daily., Disp: 30 tablet, Rfl: 1   atorvastatin (LIPITOR) 80 MG tablet, TAKE 1 TABLET BY MOUTH AT BEDTIME, Disp: 90 tablet, Rfl: 0   clopidogrel (PLAVIX) 75 MG tablet, Take 1 tablet by mouth once daily, Disp: 90 tablet, Rfl: 0   EQ ASPIRIN ADULT LOW DOSE 81 MG EC tablet, TAKE 1 TABLET BY MOUTH ONCE DAILY, SWALLOW WHOLE, Disp: 90 tablet, Rfl: 0   metoprolol succinate (TOPROL XL) 50 MG 24 hr tablet, Take 1 tablet (50 mg total) by mouth every morning., Disp: 90 tablet, Rfl: 0   omeprazole (PRILOSEC) 20 MG capsule, Take 1 capsule (20 mg total) by mouth daily as needed for up to 30 doses., Disp: 30 capsule, Rfl: 0   tamsulosin (FLOMAX) 0.4 MG CAPS capsule, Take 0.4 mg by mouth daily., Disp: , Rfl:   Orders Placed This Encounter  Procedures   EKG 12-Lead   There are no Patient  Instructions on file for this visit.   --Continue cardiac medications as reconciled in final medication list. --Return in about 6 months (around 10/19/2021) for Follow up, CAD. Or sooner if needed. --Continue follow-up with your primary care physician regarding the management of your other chronic comorbid conditions.  Patient's questions and concerns were addressed to his satisfaction. He voices understanding of the instructions provided during this encounter.   This note was created using a voice recognition software as a result there may be grammatical errors inadvertently enclosed that do not reflect the nature of this encounter. Every attempt is made to correct such errors.  Rex Kras, [place], [facility]  Pager: [phone] Office: [phone]

## 2021-07-17 ENCOUNTER — Other Ambulatory Visit: Payer: Self-pay | Admitting: Cardiology

## 2021-07-17 DIAGNOSIS — I2510 Atherosclerotic heart disease of native coronary artery without angina pectoris: Secondary | ICD-10-CM

## 2021-07-17 DIAGNOSIS — Z951 Presence of aortocoronary bypass graft: Secondary | ICD-10-CM

## 2021-07-18 ENCOUNTER — Other Ambulatory Visit: Payer: Self-pay | Admitting: Cardiology

## 2021-07-18 DIAGNOSIS — I2510 Atherosclerotic heart disease of native coronary artery without angina pectoris: Secondary | ICD-10-CM

## 2021-07-18 DIAGNOSIS — Z951 Presence of aortocoronary bypass graft: Secondary | ICD-10-CM

## 2021-10-07 ENCOUNTER — Other Ambulatory Visit: Payer: Self-pay | Admitting: Cardiology

## 2021-10-07 DIAGNOSIS — Z951 Presence of aortocoronary bypass graft: Secondary | ICD-10-CM

## 2021-10-07 DIAGNOSIS — I2510 Atherosclerotic heart disease of native coronary artery without angina pectoris: Secondary | ICD-10-CM

## 2021-10-20 ENCOUNTER — Ambulatory Visit: Payer: Federal, State, Local not specified - PPO | Admitting: Cardiology

## 2022-01-03 ENCOUNTER — Other Ambulatory Visit: Payer: Self-pay | Admitting: Cardiology

## 2022-01-03 DIAGNOSIS — I2510 Atherosclerotic heart disease of native coronary artery without angina pectoris: Secondary | ICD-10-CM

## 2022-01-03 DIAGNOSIS — Z951 Presence of aortocoronary bypass graft: Secondary | ICD-10-CM

## 2022-01-08 ENCOUNTER — Other Ambulatory Visit: Payer: Self-pay | Admitting: Cardiology

## 2022-01-08 DIAGNOSIS — Z951 Presence of aortocoronary bypass graft: Secondary | ICD-10-CM

## 2022-01-08 DIAGNOSIS — I2510 Atherosclerotic heart disease of native coronary artery without angina pectoris: Secondary | ICD-10-CM

## 2022-01-10 ENCOUNTER — Other Ambulatory Visit: Payer: Self-pay | Admitting: Cardiology

## 2022-01-10 DIAGNOSIS — I2510 Atherosclerotic heart disease of native coronary artery without angina pectoris: Secondary | ICD-10-CM

## 2022-01-10 DIAGNOSIS — Z951 Presence of aortocoronary bypass graft: Secondary | ICD-10-CM

## 2022-01-23 ENCOUNTER — Other Ambulatory Visit: Payer: Self-pay | Admitting: Cardiology

## 2022-01-23 DIAGNOSIS — I2510 Atherosclerotic heart disease of native coronary artery without angina pectoris: Secondary | ICD-10-CM

## 2022-01-23 DIAGNOSIS — Z951 Presence of aortocoronary bypass graft: Secondary | ICD-10-CM

## 2022-02-06 ENCOUNTER — Ambulatory Visit: Payer: Federal, State, Local not specified - PPO

## 2022-02-06 VITALS — BP 137/71 | HR 45 | Ht 67.0 in | Wt 172.0 lb

## 2022-02-06 DIAGNOSIS — I2510 Atherosclerotic heart disease of native coronary artery without angina pectoris: Secondary | ICD-10-CM | POA: Diagnosis not present

## 2022-02-06 DIAGNOSIS — Z951 Presence of aortocoronary bypass graft: Secondary | ICD-10-CM | POA: Diagnosis not present

## 2022-02-06 DIAGNOSIS — E782 Mixed hyperlipidemia: Secondary | ICD-10-CM

## 2022-02-06 DIAGNOSIS — I10 Essential (primary) hypertension: Secondary | ICD-10-CM

## 2022-02-06 NOTE — Progress Notes (Signed)
ID:  Tawny Asal, DOB 1961-02-06, MRN [id number]  PCP:  Maury Dus, MD  Cardiologist:  Rex Kras, DO, [facility] (established care 06/06/2020) Former Cardiology Providers: NA Cardiothoracic surgeon: Dr. Kipp Brood  Date: 02/06/22 Last Office Visit: 10/17/2020  Chief Complaint  Patient presents with   Follow-up    6 month   Coronary Artery Disease   Hypertension   Hyperlipidemia    HPI  Brian Curry is a 61 y.o. African American  male who presents to the office with a chief complaint of " 16-monthfollow-up for CAD management." Patient's past medical history and cardiovascular risk factors include: Multivessel CAD, status post two-vessel CABG (LIMA to the LAD, left radial to OM1), hypertension, anxiety, BPH, obesity due to excess calories, mixed hyperlipidemia, bilateral carpal tunnel.  He is referred to the office at the request of RMaury Dus MD for evaluation of family history of heart disease.  Establish with the office back in March 2022 for symptoms of precordial pain, heartburn-like symptoms, and family history of CAD.  Given his symptoms that shared decision was to proceed with cardiac CTA which noted multivessel CAD.  Patient subsequently underwent invasive angiography which confirmed the findings and was referred to cardiothoracic surgery for bypass evaluation. On 07/09/2020 patient underwent two-vessel bypass with Dr. LKipp Broodand postoperatively has done well.  Patient presents for his 670-monthollow-up visit.  He remains asymptomatic from a cardiovascular standpoint. He denies angina pectoris or heart failure symptoms.  He is currently walking 10 miles per day without issue.  Family hx of CAD mom and dad. Dad had MI around age of 5322nd Mom had MI around 6096  FUNCTIONAL STATUS: Walks 8,000-10,000 steps daily and 3 miles several days a week.   ALLERGIES: No Known Allergies  MEDICATION LIST PRIOR TO VISIT: Current Meds  Medication Sig   amLODipine  (NORVASC) 5 MG tablet Take 1 tablet (5 mg total) by mouth daily.   ASPIRIN LOW DOSE 81 MG tablet TAKE 1 TABLET BY MOUTH ONCE DAILY, SWALLOW  WHOLE   atorvastatin (LIPITOR) 80 MG tablet TAKE 1 TABLET BY MOUTH AT BEDTIME   clopidogrel (PLAVIX) 75 MG tablet TAKE 1 TABLET BY MOUTH ONCE DAILY . APPOINTMENT REQUIRED FOR FUTURE REFILLS   metoprolol succinate (TOPROL-XL) 50 MG 24 hr tablet TAKE 1 TABLET BY MOUTH ONCE DAILY IN THE MORNING   omeprazole (PRILOSEC) 20 MG capsule Take 1 capsule (20 mg total) by mouth daily as needed for up to 30 doses.   tamsulosin (FLOMAX) 0.4 MG CAPS capsule Take 0.4 mg by mouth daily.     PAST MEDICAL HISTORY: Past Medical History:  Diagnosis Date   BPH (benign prostatic hyperplasia)    Coronary artery disease    GERD (gastroesophageal reflux disease)    Hyperlipidemia    Hypertension     PAST SURGICAL HISTORY: Past Surgical History:  Procedure Laterality Date   COLONOSCOPY     CORONARY ARTERY BYPASS GRAFT N/A 07/09/2020   Procedure: CORONARY ARTERY BYPASS GRAFTING (CABG) X TWO USING LEFT INTERNAL MAMMARY ARTERY AND LEFT RADIAL ARTERY;  Surgeon: LiLajuana MatteMD;  [facility]: [facility];  Laterality: N/A;   HERNIA REPAIR     LEFT HEART CATH AND CORONARY ANGIOGRAPHY N/A 07/02/2020   Procedure: LEFT HEART CATH AND CORONARY ANGIOGRAPHY;  Surgeon: PaNigel MormonMD;  [facility];  Service: Cardiovascular;  Laterality: N/A;   RADIAL ARTERY HARVEST Left 07/09/2020  Procedure: RADIAL ARTERY HARVEST;  Surgeon: Lajuana Matte, MD;  Location: [place];  Service: Open Heart Surgery;  Laterality: Left;   TEE WITHOUT CARDIOVERSION N/A 07/09/2020   Procedure: TRANSESOPHAGEAL ECHOCARDIOGRAM (TEE);  Surgeon: Lajuana Matte, MD;  Location: [place];  Service: Open Heart Surgery;  Laterality: N/A;   WISDOM TOOTH EXTRACTION      FAMILY HISTORY: The patient family history includes Heart attack in his father and mother;  Hypertension in his father and mother; Stroke in his father.  SOCIAL HISTORY:  The patient  reports that he has never smoked. He has never used smokeless tobacco. He reports current alcohol use. He reports that he does not use drugs.  REVIEW OF SYSTEMS: Review of Systems  Cardiovascular:  Negative for chest pain, claudication, dyspnea on exertion, leg swelling, near-syncope, orthopnea, palpitations, paroxysmal nocturnal dyspnea and syncope.  Respiratory:  Negative for shortness of breath.   Musculoskeletal:  Negative for muscle cramps and myalgias.  Neurological:  Negative for dizziness and light-headedness.    PHYSICAL EXAM:    02/06/2022   11:23 AM 04/21/2021    1:34 PM 10/17/2020    2:09 PM  Vitals with BMI  Height _0  _1  _2   Weight 172 lbs 179 lbs 13 oz 183 lbs 13 oz  BMI 26.93 40.97 35.32  Systolic 992 426 834  Diastolic 71 74 80  Pulse 45 61 65   Physical Exam Cardiovascular:     Rate and Rhythm: Regular rhythm. Bradycardia present.     Pulses: Normal pulses.     Heart sounds: Normal heart sounds. No murmur heard.    No gallop.  Pulmonary:     Effort: Pulmonary effort is normal.     Breath sounds: Normal breath sounds. No wheezing or rales.  Abdominal:     General: Bowel sounds are normal.     Palpations: Abdomen is soft.     Tenderness: There is no abdominal tenderness.  Musculoskeletal:     Right lower leg: No edema.     Left lower leg: No edema.  Neurological:     Mental Status: He is alert.      CARDIAC DATABASE: Coronary artery bypass grafting surgery: 07/09/2020: LIMA to the LAD, left radial to OM1  EKG: 02/06/2022: Sinus bradycardia at rate of 43 bpm.  Normal axis.  No evidence of ischemia or underlying injury pattern.  Compared to previous EKG on 04/21/2021, no significant change.  Echocardiogram: 06/11/2020:  Left ventricle cavity is normal in size. Mild concentric hypertrophy of the left ventricle. Normal global wall motion. Normal LV  systolic function with EF 63%. Doppler evidence of grade I (impaired) diastolic dysfunction, normal LAP.  No significant valvular abnormality.  Normal right atrial pressure.   Stress Testing: No results found for this or any previous visit from the past 1095 days.  Coronary CTA: 06/25/2020 1. Coronary calcium score of 584. This was 97th percentile for age and sex matched control.  2. Normal coronary origin with right dominance. 3. CAD-RADS = 3. Mild stenosis (25-49%) at the distal left main due to mixed plaque. Moderate stenosis (50-69%) at the ostial/proximal LAD due to mixed plaque. Mild stenosis (25-49%) in the proximal LCx due to calcified plaque. Mild stenosis (25-49%) at the ostial OM1 due to mixed plaque. Mild stenosis (25-49%) in mid RCA due to calcified plaque.  4. Aortic atherosclerosis. 5. Study is sent for CT-FFR to further evaluate the LM and LAD disease. Findings will be performed and reported separately.  Heart Catheterization: 07/02/2020 LM: Distal 90% stenosis LAD: Ostial-prox 95-90% tandem stenoses LCx: Prox LCx 50% stenosis RCA: Mid 20% stenosis   Excellent surgical targets in LAD/LCx   LVEDP normal  Patient has had chronic stable angina, currently asymptomatic and HD stable. I have consulted CVTS for urgent outpatient consultation. Cautioned the patient to avoid any strenuous physical activity.    LABORATORY DATA:    Latest Ref Rng & Units 07/12/2020    2:57 AM 07/11/2020    4:47 AM 07/10/2020    5:00 PM  CBC  WBC 4.0 - 10.5 K/uL 11.4  12.5  14.2   Hemoglobin 13.0 - 17.0 g/dL 12.1  12.2  12.4   Hematocrit 39.0 - 52.0 % 34.4  34.9  35.9   Platelets 150 - 400 K/uL 164  154  155        Latest Ref Rng & Units 07/12/2020    2:57 AM 07/11/2020    4:47 AM 07/10/2020    5:00 PM  CMP  Glucose 70 - 99 mg/dL 132  152  152   BUN 8 - 23 mg/dL _0 Creatinine 0.61 - 1.24 mg/dL 1.18  1.24  1.20   Sodium 135 - 145 mmol/L 132  134  131   Potassium 3.5 - 5.1  mmol/L 3.4  3.8  4.0   Chloride 98 - 111 mmol/L 100  102  100   CO2 22 - 32 mmol/L _1 Calcium 8.9 - 10.3 mg/dL 8.4  8.4  8.3     Lipid Panel     Component Value Date/Time   CHOL 143 06/28/2020 1001   TRIG 60 06/28/2020 1001   HDL 41 06/28/2020 1001   LDLCALC 90 06/28/2020 1001   LDLDIRECT 82 06/28/2020 1001   LABVLDL 12 06/28/2020 1001   HEMOGLOBIN A1C Lab Results  Component Value Date   HGBA1C 5.5 07/05/2020   MPG 111.15 07/05/2020    External Labs: Collected: 09/11/2020 provided by PCP Hemoglobin 14.2 g/dL. Hematocrit 43%. Creatinine 1.22 mg/dL. eGFR: 67 mL/min per 1.73 m Sodium 140, potassium 4.5, chloride 104, bicarb 30 ALT 17 AST 19, alkaline phosphatase 102 (within normal limits) Lipid profile: Total cholesterol 124, triglycerides 56, HDL 35, LDL 77, non-HDL 89 Hemoglobin A1c: 5.5  Collected: 02/28/2021 available in Care Everywhere through [facility] Hemoglobin A1c 5.4. Hemoglobin 15.4 g/dL, hematocrit 44.4%. BUN 13, creatinine 1.08 mg/dL. Sodium 139, potassium 4.1, chloride 102, bicarb 30. Alkaline phosphatase 96, AST 24, ALT 21. Total cholesterol 141, HDL 44, triglycerides 48, LDL 87  IMPRESSION:    ICD-10-CM   1. Atherosclerosis of native coronary artery of native heart without angina pectoris  I25.10     2. S/P CABG (coronary artery bypass graft)  Z95.1     3. Benign hypertension  I10 EKG 12-Lead    CMP14+EGFR    CBC    4. Mixed hyperlipidemia  E78.2 Lipid Panel With LDL/HDL Ratio        RECOMMENDATIONS: Brian Curry is a 61 y.o. African American male whose past medical history and cardiac risk factors include:  Hypertension, anxiety, BPH, obesity due to excess calories, mixed hyperlipidemia, bilateral carpal tunnel.   Established coronary artery disease status post two-vessel CABG without angina pectoris Status post two-vessel bypass surgery. Asymptomatic with regards to chest pain or anginal equivalent. No  use of sublingual nitroglycerin tablets. Medications reconciled. Amlodipine due to left radial artery harvesting. EKG shows sinus bradycardia  without underlying injury pattern. He has not had labs completed recently therefore will order CMP, lipid profile testing, and CBC today.  Benign essential hypertension: Office and home blood pressures are very well controlled. Medications reconciled. Educated on importance of a low-salt diet. Continue exercise regimen. Continue to monitor.  Hyperlipidemia, mixed: Continue statin therapy. Does not endorse myalgias.  FINAL MEDICATION LIST END OF ENCOUNTER: No orders of the defined types were placed in this encounter.   There are no discontinued medications.     Current Outpatient Medications:    amLODipine (NORVASC) 5 MG tablet, Take 1 tablet (5 mg total) by mouth daily., Disp: 30 tablet, Rfl: 1   ASPIRIN LOW DOSE 81 MG tablet, TAKE 1 TABLET BY MOUTH ONCE DAILY, SWALLOW  WHOLE, Disp: 90 tablet, Rfl: 0   atorvastatin (LIPITOR) 80 MG tablet, TAKE 1 TABLET BY MOUTH AT BEDTIME, Disp: 90 tablet, Rfl: 0   clopidogrel (PLAVIX) 75 MG tablet, TAKE 1 TABLET BY MOUTH ONCE DAILY . APPOINTMENT REQUIRED FOR FUTURE REFILLS, Disp: 90 tablet, Rfl: 1   metoprolol succinate (TOPROL-XL) 50 MG 24 hr tablet, TAKE 1 TABLET BY MOUTH ONCE DAILY IN THE MORNING, Disp: 90 tablet, Rfl: 0   omeprazole (PRILOSEC) 20 MG capsule, Take 1 capsule (20 mg total) by mouth daily as needed for up to 30 doses., Disp: 30 capsule, Rfl: 0   tamsulosin (FLOMAX) 0.4 MG CAPS capsule, Take 0.4 mg by mouth daily., Disp: , Rfl:   Orders Placed This Encounter  Procedures   CMP14+EGFR   CBC   Lipid Panel With LDL/HDL Ratio   EKG 12-Lead   There are no Patient Instructions on file for this visit.   --Continue cardiac medications as reconciled in final medication list. --Return in about 6 months (around 08/07/2022) for CAD, HTN. Or sooner if needed. --Continue follow-up with your primary  care physician regarding the management of your other chronic comorbid conditions.  Patient's questions and concerns were addressed to his satisfaction. He voices understanding of the instructions provided during this encounter.   This note was created using a voice recognition software as a result there may be grammatical errors inadvertently enclosed that do not reflect the nature of this encounter. Every attempt is made to correct such errors.    Ernst Spell, [place] Pager: [phone] Office: [phone]

## 2022-02-07 LAB — CMP14+EGFR
ALT: 16 IU/L (ref 0–44)
AST: 18 IU/L (ref 0–40)
Albumin/Globulin Ratio: 1.7 (ref 1.2–2.2)
Albumin: 4.3 g/dL (ref 3.9–4.9)
Alkaline Phosphatase: 96 IU/L (ref 44–121)
BUN/Creatinine Ratio: 13 (ref 10–24)
BUN: 15 mg/dL (ref 8–27)
Bilirubin Total: 0.7 mg/dL (ref 0.0–1.2)
CO2: 26 mmol/L (ref 20–29)
Calcium: 9.1 mg/dL (ref 8.6–10.2)
Chloride: 102 mmol/L (ref 96–106)
Creatinine, Ser: 1.12 mg/dL (ref 0.76–1.27)
Globulin, Total: 2.6 g/dL (ref 1.5–4.5)
Glucose: 100 mg/dL — ABNORMAL HIGH (ref 70–99)
Potassium: 4.5 mmol/L (ref 3.5–5.2)
Sodium: 140 mmol/L (ref 134–144)
Total Protein: 6.9 g/dL (ref 6.0–8.5)
eGFR: 74 mL/min/{1.73_m2} (ref >59)

## 2022-02-07 LAB — CBC
Hematocrit: 49.9 % (ref 37.5–51.0)
Hemoglobin: 16.5 g/dL (ref 13.0–17.7)
MCH: 28.7 pg (ref 26.6–33.0)
MCHC: 33.1 g/dL (ref 31.5–35.7)
MCV: 87 fL (ref 79–97)
Platelets: 172 10*3/uL (ref 150–450)
RBC: 5.74 x10E6/uL (ref 4.14–5.80)
RDW: 12.7 % (ref 11.6–15.4)
WBC: 3.7 10*3/uL (ref 3.4–10.8)

## 2022-02-07 LAB — LIPID PANEL WITH LDL/HDL RATIO
Cholesterol, Total: 136 mg/dL (ref 100–199)
HDL: 49 mg/dL (ref >39)
LDL Chol Calc (NIH): 78 mg/dL (ref 0–99)
LDL/HDL Ratio: 1.6 ratio (ref 0.0–3.6)
Triglycerides: 38 mg/dL (ref 0–149)
VLDL Cholesterol Cal: 9 mg/dL (ref 5–40)

## 2022-02-22 IMAGING — DX DG CHEST 1V PORT
1 series · 1 of 1 positions shown · non-contrast
Comparison: 07/05/2020

CLINICAL DATA: Status post CABG

EXAM:
PORTABLE CHEST 1 VIEW

[chest]
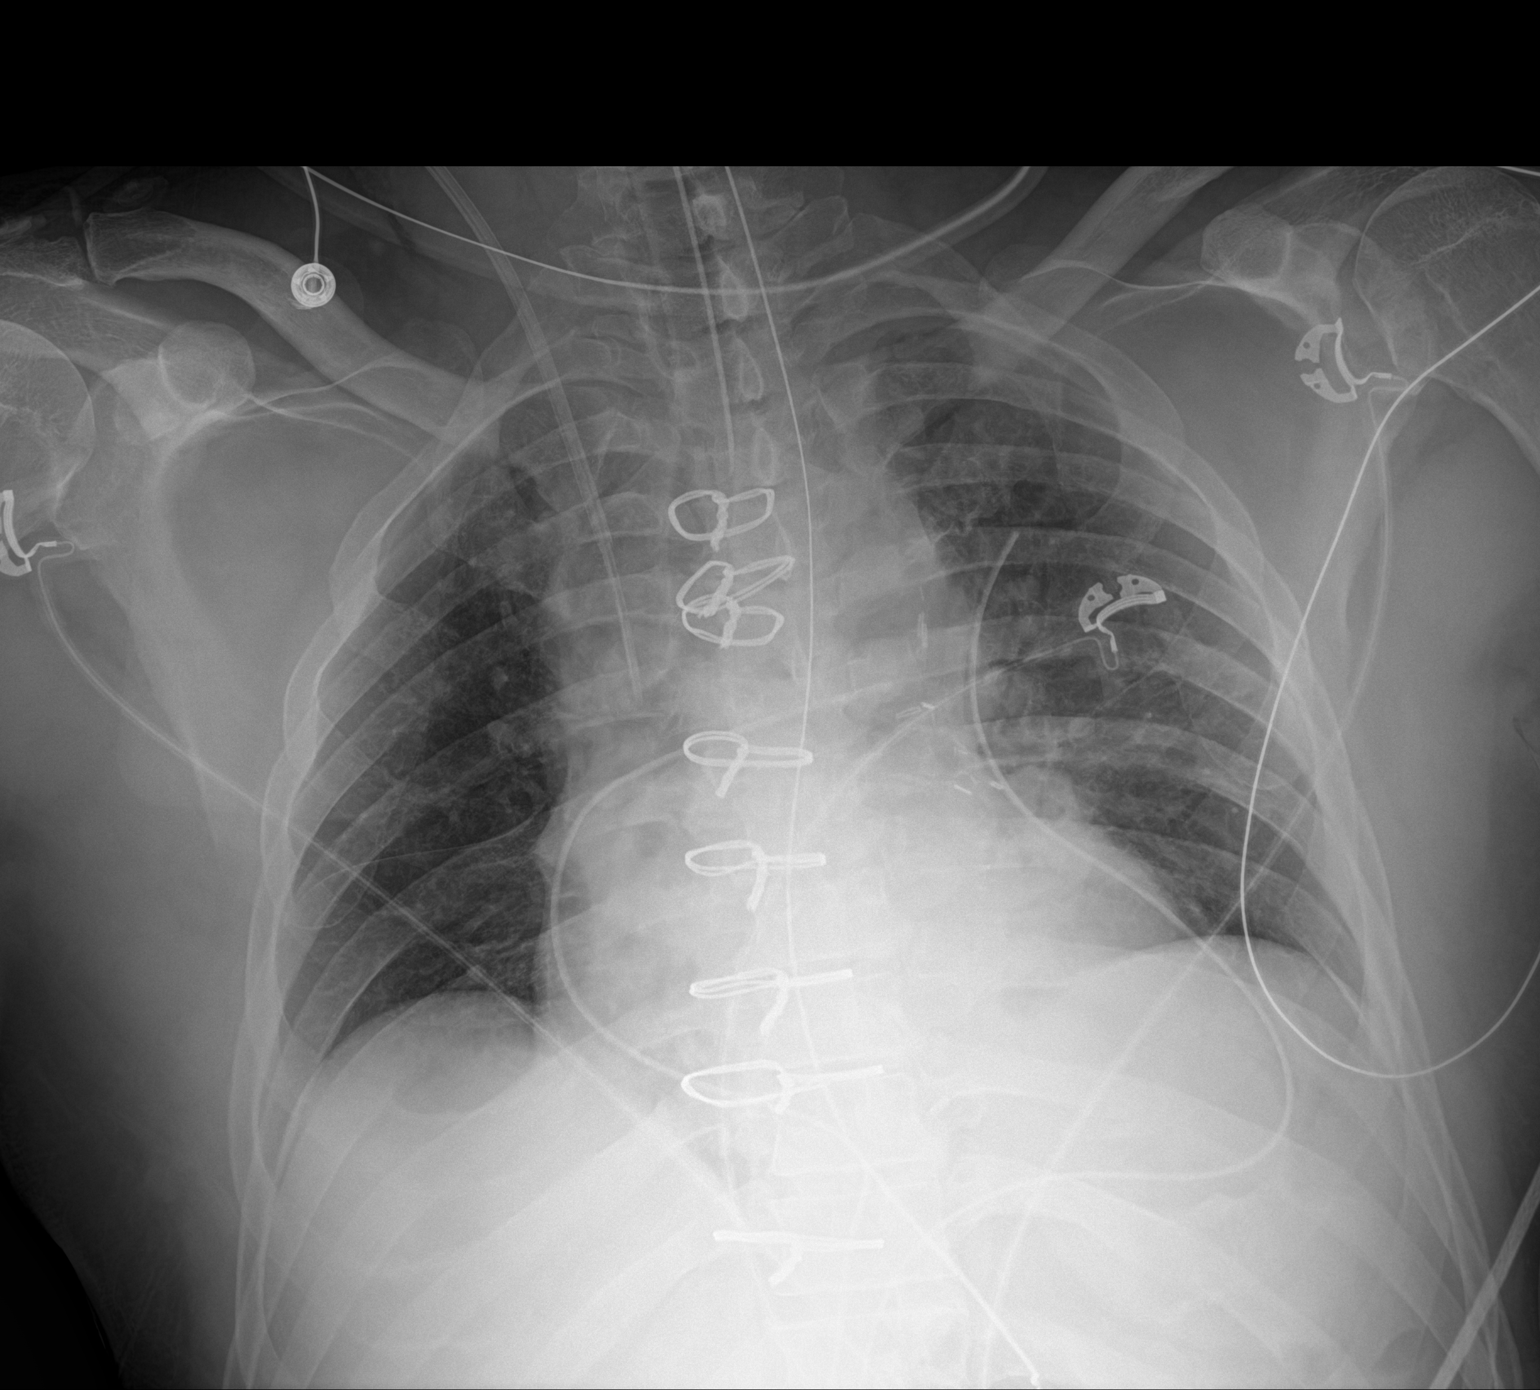

[1 of 1 positions shown; findings below may reference images not displayed]

FINDINGS: Endotracheal tube with the tip 4.5 cm above the carina. Nasogastric
tube coursing below the diaphragm. Right jugular central venous
catheter with the tip projecting over the SVC. Left-sided chest
tube. Mediastinal drain. No focal consolidation. No pleural effusion
or pneumothorax. Heart and mediastinal contours are unremarkable.
Interval CABG.

No acute osseous abnormality.
IMPRESSION: 1. Support lines and tubing in satisfactory position.
2. No acute cardiopulmonary disease.

## 2022-02-23 IMAGING — DX DG CHEST 1V PORT
1 series · 1 of 1 positions shown · non-contrast
Comparison: July 09, 2020

CLINICAL DATA: Chest pain.  Status postextubation

EXAM:
PORTABLE CHEST 1 VIEW

[chest]
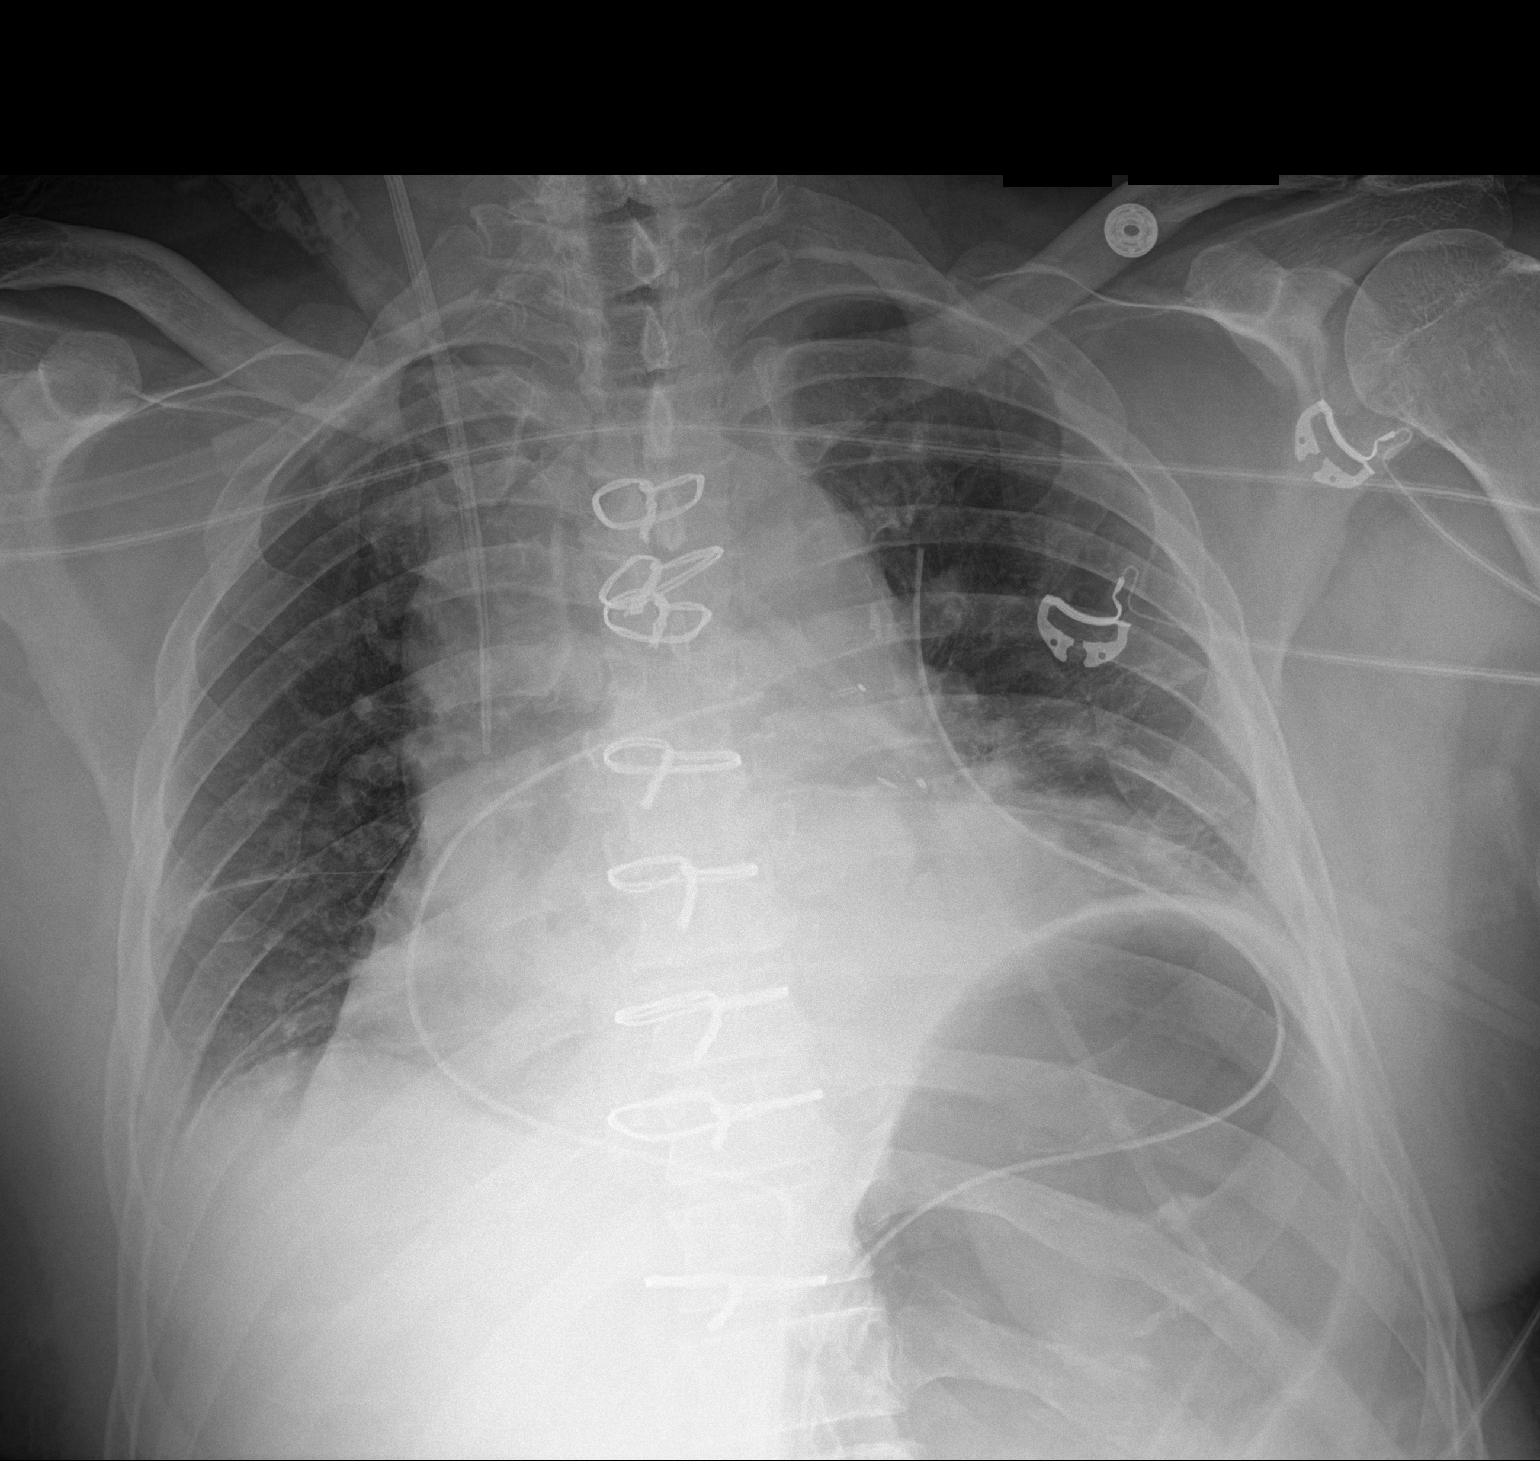

[1 of 1 positions shown; findings below may reference images not displayed]

FINDINGS: Endotracheal tube and nasogastric tube have been removed. Central
catheter tip is in the superior vena cava. Mediastinal drain and
left chest tube again noted. No pneumothorax. There is left lower
lobe atelectatic change. There is mild consolidation medial aspect
of each lung base. There is stable cardiac enlargement with
pulmonary vascularity normal. No evident adenopathy. No bone
lesions.
IMPRESSION: Tube and catheter positions as described without evident
pneumothorax. Medial airspace opacity likely due to atelectasis with
potential superimposed pneumonia in the medial lung bases noted.
There is also left lower lobe region atelectasis. Stable
cardiomegaly.

## 2022-04-02 ENCOUNTER — Other Ambulatory Visit: Payer: Self-pay | Admitting: Cardiology

## 2022-04-02 DIAGNOSIS — Z951 Presence of aortocoronary bypass graft: Secondary | ICD-10-CM

## 2022-04-02 DIAGNOSIS — I2510 Atherosclerotic heart disease of native coronary artery without angina pectoris: Secondary | ICD-10-CM

## 2022-05-26 DIAGNOSIS — E782 Mixed hyperlipidemia: Secondary | ICD-10-CM | POA: Diagnosis not present

## 2022-05-26 DIAGNOSIS — R399 Unspecified symptoms and signs involving the genitourinary system: Secondary | ICD-10-CM | POA: Diagnosis not present

## 2022-05-26 DIAGNOSIS — Z0000 Encounter for general adult medical examination without abnormal findings: Secondary | ICD-10-CM | POA: Diagnosis not present

## 2022-05-26 DIAGNOSIS — Z125 Encounter for screening for malignant neoplasm of prostate: Secondary | ICD-10-CM | POA: Diagnosis not present

## 2022-05-26 DIAGNOSIS — Z23 Encounter for immunization: Secondary | ICD-10-CM | POA: Diagnosis not present

## 2022-05-26 DIAGNOSIS — I25810 Atherosclerosis of coronary artery bypass graft(s) without angina pectoris: Secondary | ICD-10-CM | POA: Diagnosis not present

## 2022-05-26 DIAGNOSIS — I10 Essential (primary) hypertension: Secondary | ICD-10-CM | POA: Diagnosis not present

## 2022-06-29 ENCOUNTER — Other Ambulatory Visit: Payer: Self-pay | Admitting: Cardiology

## 2022-06-29 DIAGNOSIS — I2510 Atherosclerotic heart disease of native coronary artery without angina pectoris: Secondary | ICD-10-CM

## 2022-06-29 DIAGNOSIS — Z951 Presence of aortocoronary bypass graft: Secondary | ICD-10-CM

## 2022-07-06 DIAGNOSIS — R3911 Hesitancy of micturition: Secondary | ICD-10-CM | POA: Diagnosis not present

## 2022-07-06 DIAGNOSIS — R3912 Poor urinary stream: Secondary | ICD-10-CM | POA: Diagnosis not present

## 2022-07-06 DIAGNOSIS — R351 Nocturia: Secondary | ICD-10-CM | POA: Diagnosis not present

## 2022-07-23 ENCOUNTER — Encounter: Payer: Self-pay | Admitting: Cardiology

## 2022-07-23 ENCOUNTER — Ambulatory Visit: Payer: Federal, State, Local not specified - PPO | Admitting: Cardiology

## 2022-07-23 VITALS — BP 129/80 | HR 56 | Ht 67.0 in | Wt 175.0 lb

## 2022-07-23 DIAGNOSIS — Z951 Presence of aortocoronary bypass graft: Secondary | ICD-10-CM | POA: Diagnosis not present

## 2022-07-23 DIAGNOSIS — E782 Mixed hyperlipidemia: Secondary | ICD-10-CM | POA: Diagnosis not present

## 2022-07-23 DIAGNOSIS — I10 Essential (primary) hypertension: Secondary | ICD-10-CM | POA: Diagnosis not present

## 2022-07-23 DIAGNOSIS — I2510 Atherosclerotic heart disease of native coronary artery without angina pectoris: Secondary | ICD-10-CM | POA: Diagnosis not present

## 2022-07-23 DIAGNOSIS — Z8249 Family history of ischemic heart disease and other diseases of the circulatory system: Secondary | ICD-10-CM

## 2022-07-23 MED ORDER — METOPROLOL SUCCINATE ER 25 MG PO TB24: 25.0000 mg | ORAL_TABLET | Freq: Every morning | ORAL | 0 refills | Status: DC

## 2022-07-23 MED ORDER — NEXLIZET 180-10 MG PO TABS: 1.0000 | ORAL_TABLET | Freq: Every day | ORAL | 0 refills | Status: DC

## 2022-07-23 NOTE — Progress Notes (Signed)
ID:  Brian Curry, DOB 1960/04/16, MRN [id number]  PCP:  Aliene Beams, MD  Cardiologist:  Tessa Lerner, DO, [facility] (established care 06/06/2020) Former Cardiology Providers: NA Cardiothoracic surgeon: Dr. Cliffton Asters  Date: 07/23/22 Last Office Visit: February 06, 2022  Chief Complaint  Patient presents with   Atherosclerosis of native coronary artery of native heart w   Follow-up    HPI  Brian Curry is a 62 y.o. African American  male whose past medical history and cardiovascular risk factors include: Multivessel CAD, status post two-vessel CABG (LIMA to the LAD, left radial to OM1), hypertension, anxiety, BPH, obesity due to excess calories, mixed hyperlipidemia, bilateral carpal tunnel.  Patient presents today for follow-up given his underlying CAD with history of two-vessel bypass.  In March 2022 he had symptoms of precordial pain which is ongoing and he was told this likely heartburn.  But given his family history of CAD and his risk factors he underwent ischemic workup as outlined below and eventually on July 09, 2020 had a two-vessel bypass with Dr. Cliffton Asters.  Since last office visit patient denies anginal chest pain, burning-like sensation, or heart failure symptoms.  Patient has been busy with work and traveling often and working 12 to 17 hours/day.  At times he has noticed that he wakes up in the morning around 3:00 - 3:30 AM with thumping feeling in the chest and based on his smart watch heart rates are 105 bpm.  Patient states that he usually gets a heart rate above 100 when he is exercising and therefore he is a little bit concerned.  Differential diagnosis includes: Anxiety, staying away from home, etc.  He is walking at least 5 to 6 miles per day otherwise gets about 20,000 steps per day at work.  Family hx of CAD mom and dad. Dad had MI around age of 97 and Mom had MI around 42.   FUNCTIONAL STATUS: Walks 8,000-10,000 steps daily and 3 miles several  days a week. He is currently walking 10 miles per day without issue.  ALLERGIES: No Known Allergies  MEDICATION LIST PRIOR TO VISIT: Current Meds  Medication Sig   amLODipine (NORVASC) 5 MG tablet Take 1 tablet (5 mg total) by mouth daily.   ASPIRIN LOW DOSE 81 MG tablet TAKE 1 TABLET BY MOUTH ONCE DAILY, SWALLOW  WHOLE   atorvastatin (LIPITOR) 80 MG tablet TAKE 1 TABLET BY MOUTH AT BEDTIME   Bempedoic Acid-Ezetimibe (NEXLIZET) 180-10 MG TABS Take 1 tablet by mouth daily.   clopidogrel (PLAVIX) 75 MG tablet TAKE 1 TABLET BY MOUTH ONCE DAILY . APPOINTMENT REQUIRED FOR FUTURE REFILLS   metoprolol succinate (TOPROL XL) 25 MG 24 hr tablet Take 1 tablet (25 mg total) by mouth in the morning.   omeprazole (PRILOSEC) 20 MG capsule Take 1 capsule (20 mg total) by mouth daily as needed for up to 30 doses.   tamsulosin (FLOMAX) 0.4 MG CAPS capsule Take 0.4 mg by mouth daily.   [DISCONTINUED] metoprolol succinate (TOPROL-XL) 50 MG 24 hr tablet TAKE 1 TABLET BY MOUTH ONCE DAILY IN THE MORNING     PAST MEDICAL HISTORY: Past Medical History:  Diagnosis Date   BPH (benign prostatic hyperplasia)    Coronary artery disease    GERD (gastroesophageal reflux disease)    Hyperlipidemia    Hypertension     PAST SURGICAL HISTORY: Past Surgical History:  Procedure Laterality Date   COLONOSCOPY     CORONARY ARTERY BYPASS GRAFT N/A 07/09/2020  Procedure: CORONARY ARTERY BYPASS GRAFTING (CABG) X TWO USING LEFT INTERNAL MAMMARY ARTERY AND LEFT RADIAL ARTERY;  Surgeon: Corliss Skains, MD;  Location: [facility] OR;  Service: Open Heart Surgery;  Laterality: N/A;   HERNIA REPAIR     LEFT HEART CATH AND CORONARY ANGIOGRAPHY N/A 07/02/2020   Procedure: LEFT HEART CATH AND CORONARY ANGIOGRAPHY;  Surgeon: Elder Negus, MD;  Location: [facility] INVASIVE CV LAB;  Service: Cardiovascular;  Laterality: N/A;   RADIAL ARTERY HARVEST Left 07/09/2020   Procedure: RADIAL ARTERY HARVEST;  Surgeon: Corliss Skains, MD;   Location: [facility] OR;  Service: Open Heart Surgery;  Laterality: Left;   TEE WITHOUT CARDIOVERSION N/A 07/09/2020   Procedure: TRANSESOPHAGEAL ECHOCARDIOGRAM (TEE);  Surgeon: Corliss Skains, MD;  Location: [facility] OR;  Service: Open Heart Surgery;  Laterality: N/A;   WISDOM TOOTH EXTRACTION      FAMILY HISTORY: The patient family history includes Heart attack in his father and mother; Hypertension in his father and mother; Stroke in his father.  SOCIAL HISTORY:  The patient  reports that he has never smoked. He has never used smokeless tobacco. He reports current alcohol use. He reports that he does not use drugs.  REVIEW OF SYSTEMS: Review of Systems  Cardiovascular:  Negative for chest pain, claudication, dyspnea on exertion, leg swelling, near-syncope, orthopnea, palpitations, paroxysmal nocturnal dyspnea and syncope.  Respiratory:  Negative for shortness of breath.   Musculoskeletal:  Negative for muscle cramps and myalgias.  Neurological:  Negative for dizziness and light-headedness.    PHYSICAL EXAM:    07/23/2022   10:54 AM 02/06/2022   11:23 AM 04/21/2021    1:34 PM  Vitals with BMI  Height 5\' 7"  5\' 7"  5\' 7"   Weight 175 lbs 172 lbs 179 lbs 13 oz  BMI 27.4 26.93 28.15  Systolic 129 137 161  Diastolic 80 71 74  Pulse 56 45 61   Physical Exam Cardiovascular:     Rate and Rhythm: Regular rhythm. Bradycardia present.     Pulses: Normal pulses.     Heart sounds: Normal heart sounds. No murmur heard.    No gallop.  Pulmonary:     Effort: Pulmonary effort is normal.     Breath sounds: Normal breath sounds. No wheezing or rales.  Abdominal:     General: Bowel sounds are normal.     Palpations: Abdomen is soft.     Tenderness: There is no abdominal tenderness.  Musculoskeletal:     Right lower leg: No edema.     Left lower leg: No edema.  Neurological:     Mental Status: He is alert.      CARDIAC DATABASE: Coronary artery bypass grafting surgery: 07/09/2020: LIMA to the  LAD, left radial to OM1  EKG: July 23, 2022: Sinus bradycardia, 49 bpm, normal axis, without underlying ischemia or injury pattern.  Echocardiogram: 06/11/2020:  Left ventricle cavity is normal in size. Mild concentric hypertrophy of the left ventricle. Normal global wall motion. Normal LV systolic function with EF 63%. Doppler evidence of grade I (impaired) diastolic dysfunction, normal LAP.  No significant valvular abnormality.  Normal right atrial pressure.   Stress Testing: No results found for this or any previous visit from the past 1095 days.  Coronary CTA: 06/25/2020 1. Coronary calcium score of 584. This was 97th percentile for age and sex matched control.  2. Normal coronary origin with right dominance. 3. CAD-RADS = 3. Mild stenosis (25-49%) at the distal left main due to mixed  plaque. Moderate stenosis (50-69%) at the ostial/proximal LAD due to mixed plaque. Mild stenosis (25-49%) in the proximal LCx due to calcified plaque. Mild stenosis (25-49%) at the ostial OM1 due to mixed plaque. Mild stenosis (25-49%) in mid RCA due to calcified plaque.  4. Aortic atherosclerosis. 5. Study is sent for CT-FFR to further evaluate the LM and LAD disease. Findings will be performed and reported separately.  Heart Catheterization: 07/02/2020 LM: Distal 90% stenosis LAD: Ostial-prox 95-90% tandem stenoses LCx: Prox LCx 50% stenosis RCA: Mid 20% stenosis   Excellent surgical targets in LAD/LCx   LVEDP normal  Patient has had chronic stable angina, currently asymptomatic and HD stable. I have consulted CVTS for urgent outpatient consultation. Cautioned the patient to avoid any strenuous physical activity.    LABORATORY DATA:    Latest Ref Rng & Units 02/06/2022   12:21 PM 07/12/2020    2:57 AM 07/11/2020    4:47 AM  CBC  WBC 3.4 - 10.8 x10E3/uL 3.7  11.4  12.5   Hemoglobin 13.0 - 17.7 g/dL 40.9  81.1  91.4   Hematocrit 37.5 - 51.0 % 49.9  34.4  34.9   Platelets 150 - 450 x10E3/uL  172  164  154        Latest Ref Rng & Units 02/06/2022   12:21 PM 07/12/2020    2:57 AM 07/11/2020    4:47 AM  CMP  Glucose 70 - 99 mg/dL 782  956  213   BUN 8 - 27 mg/dL 15  14  13    Creatinine 0.76 - 1.27 mg/dL 0.86  5.78  4.69   Sodium 134 - 144 mmol/L 140  132  134   Potassium 3.5 - 5.2 mmol/L 4.5  3.4  3.8   Chloride 96 - 106 mmol/L 102  100  102   CO2 20 - 29 mmol/L 26  26  24    Calcium 8.6 - 10.2 mg/dL 9.1  8.4  8.4   Total Protein 6.0 - 8.5 g/dL 6.9     Total Bilirubin 0.0 - 1.2 mg/dL 0.7     Alkaline Phos 44 - 121 IU/L 96     AST 0 - 40 IU/L 18     ALT 0 - 44 IU/L 16       Lipid Panel     Component Value Date/Time   CHOL 136 02/06/2022 1221   TRIG 38 02/06/2022 1221   HDL 49 02/06/2022 1221   LDLCALC 78 02/06/2022 1221   LDLDIRECT 82 06/28/2020 1001   LABVLDL 9 02/06/2022 1221   HEMOGLOBIN A1C Lab Results  Component Value Date   HGBA1C 5.5 07/05/2020   MPG 111.15 07/05/2020    External Labs: Collected: 09/11/2020 provided by PCP Hemoglobin 14.2 g/dL. Hematocrit 43%. Creatinine 1.22 mg/dL. eGFR: 67 mL/min per 1.73 m Sodium 140, potassium 4.5, chloride 104, bicarb 30 ALT 17 AST 19, alkaline phosphatase 102 (within normal limits) Lipid profile: Total cholesterol 124, triglycerides 56, HDL 35, LDL 77, non-HDL 89 Hemoglobin A1c: 5.5  Collected: 02/28/2021 available in Care Everywhere through [facility] Hemoglobin A1c 5.4. Hemoglobin 15.4 g/dL, hematocrit 62.9%. BUN 13, creatinine 1.08 mg/dL. Sodium 139, potassium 4.1, chloride 102, bicarb 30. Alkaline phosphatase 96, AST 24, ALT 21. Total cholesterol 141, HDL 44, triglycerides 48, LDL 87  Microalbumin Panel Reviewed date:05/27/2022 01:28:40 PM Interpretation:Normal Performing Lab: Notes/Report: Testing Performed at: [facility], [address], [place], [place] [address]  MA/CR ratio <5.2 0.0-30.0 mg/G    UMA <  0.7      UCR 135      PSA Reviewed date:05/26/2022 03:22:10  PM Interpretation:Normal Performing Lab: Notes/Report: Testing Performed at: [facility], [address], [place], [place] [address]  PSA 2.27 0.01-4.00 ng/mL PSA Performed by [employer]  Values obtained with different assay methods or kits cannot be used interchangeably. Results cannot be interpreted as absolute evidence of the presence or absence of malignant disease.   Lipid Panel w/reflex Reviewed date:05/27/2022 01:29:37 PM Interpretation:LDL 76/HDL 51 Performing Lab: Notes/Report: Testing Performed at: [facility], [address], [address], [place], [place] [address]  Cholesterol 137 <200 mg/dL    CHOL/HDL 2.7 0.9-8.1 Ratio    HDLD 51 30-70 mg/dL Values below 40 mg/dL indicate increased risk factor  Triglyceride 41 0-199 mg/dL    NHDL 86 1-914 mg/dL Range dependent upon risk factors.  LDL Chol Calc (NIH) 76 0-99 mg/dL    Comp Metabolic Panel Reviewed date:05/27/2022 01:29:56 PM Interpretation:GFR 73 Performing Lab: Notes/Report: Testing Performed at: [facility], [address], [address], [place], [place] [address]  Glucose 99 70-99 mg/dL    BUN 14 6-21 mg/dL    Creatinine 3.08 6.57-8.46 mg/dl    [id number] 73 >13 calc In accordance with recommendations from NKF-ASN Task Force, [facility] has updated its eGFR calc to the 2021 CKD-EDI equation that estimates kidney function without a race variable;Stage 1 > 90 ML/Min plus Albuminuria;Stage 2 60-89 ML/MIN;Stage 3 30-59 ML/MIN;Stage 4 15-29 ML/MIN;Stage 5 <15 ML/MIN  Sodium 140 136-145 mmol/L    Potassium 4.3 3.5-5.5 mmol/L    Chloride 105 98-107 mmol/L    CO2 29 22-32 mmol/L    Anion Gap 9.8 6.0-20.0 mmol/L    Calcium 9.6 8.6-10.3 mg/dL    CA-corrected 2.44 0.10-27.25 mg/dL    Protein, Total 7.0 3.6-6.4 g/dL    Albumin 4.5 4.0-3.4 g/dL    TBIL 1.1 7.4-2.5 mg/dL    ALP 85 95-638 U/L    AST 24 0-39 U/L    ALT 24 0-52 U/L    CBC with Diff Reviewed date:05/27/2022 01:30:36  PM Interpretation:stable Performing Lab: Notes/Report: Testing Performed at: [facility], [address], [address], [place], [place] [address]  WBC 3.0 4.0-11.0 K/ul    RBC 5.45 4.20-5.80 M/uL    HGB 15.8 13.0-17.0 g/dL    HCT 32.9 51.8-84.1 %    MCV 85.6 80.0-94.0 fL    MCH 29.0 27.0-33.0 pg    MCHC 33.9 32.0-36.0 g/dL    RDW 66.0 63.0-16.0 %    PLT 162 150-400 K/uL    MPV 8.8 7.5-10.7 fL    NE% 37.1 43.3-71.9 %    LY% 46.1 16.8-43.5 %    MO% 11.5 4.6-12.4 %    EO% 4.3 0.0-7.8 %    BA% 1.0 0.0-1.0 %    NE# 1.1 1.9-7.2 K/uL    LY# 1.40 1.10-2.70 K/uL    MO# 0.3 0.3-0.8 K/uL    EO# 0.1 0.0-0.6 K/uL    BA# 0.0 0.0-0.1 K/uL    NRBC% 0.50      NRBC# 0.02        IMPRESSION:    ICD-10-CM   1. Atherosclerosis of native coronary artery of native heart without angina pectoris  I25.10 EKG 12-Lead    metoprolol succinate (TOPROL XL) 25 MG 24 hr tablet    Bempedoic Acid-Ezetimibe (NEXLIZET) 180-10 MG TABS    Lipid Panel With LDL/HDL Ratio    LDL cholesterol, direct    CMP14+EGFR    CMP14+EGFR  LDL cholesterol, direct    Lipid Panel With LDL/HDL Ratio    2. S/P CABG (coronary artery bypass graft)  Z95.1 metoprolol succinate (TOPROL XL) 25 MG 24 hr tablet    Bempedoic Acid-Ezetimibe (NEXLIZET) 180-10 MG TABS    Lipid Panel With LDL/HDL Ratio    LDL cholesterol, direct    CMP14+EGFR    CMP14+EGFR    LDL cholesterol, direct    Lipid Panel With LDL/HDL Ratio    3. Benign hypertension  I10     4. Mixed hyperlipidemia  E78.2     5. Family history of premature CAD  Z82.49         RECOMMENDATIONS: ZYON ROSSER is a 62 y.o. African American male whose past medical history and cardiac risk factors include:  Hypertension, anxiety, BPH, obesity due to excess calories, mixed hyperlipidemia, bilateral carpal tunnel.   Atherosclerosis of native coronary artery of native heart without angina pectoris S/P CABG (coronary artery bypass graft) Status post two-vessel bypass  in April 2022. Denies anginal chest pain or heart failure symptoms. EKG: Nonischemic Overall functional capacity remains stable. No use of sublingual nitroglycerin tablets. Given his bradycardia we will reduce Toprol-XL to 25 mg p.o. daily He is currently on high intensity statin though his LDL levels are still greater than 55 mg/dL. Will start next visit with repeat labs in 6 weeks to reevaluate therapy Outside labs independently reviewed from care everywhere noted above for further reference. Given the left radial artery harvesting currently on amlodipine  Benign hypertension Office blood pressures are well-controlled on current medical therapy. No changes warranted at this time  Mixed hyperlipidemia Currently on atorvastatin.   He denies myalgia or other side effects. Most recent lipids dated 11/17/2022, independently reviewed as noted above. Given his history of CAD status post CABG recommended goal LDL level less than 55 mg/dL. Will start next visit as discussed above. Repeat labs in 6 weeks as noted above.  FINAL MEDICATION LIST END OF ENCOUNTER: Meds ordered this encounter  Medications   metoprolol succinate (TOPROL XL) 25 MG 24 hr tablet    Sig: Take 1 tablet (25 mg total) by mouth in the morning.    Dispense:  90 tablet    Refill:  0   Bempedoic Acid-Ezetimibe (NEXLIZET) 180-10 MG TABS    Sig: Take 1 tablet by mouth daily.    Dispense:  90 tablet    Refill:  0    Medications Discontinued During This Encounter  Medication Reason   metoprolol succinate (TOPROL-XL) 50 MG 24 hr tablet Dose change     Current Outpatient Medications:    amLODipine (NORVASC) 5 MG tablet, Take 1 tablet (5 mg total) by mouth daily., Disp: 30 tablet, Rfl: 1   ASPIRIN LOW DOSE 81 MG tablet, TAKE 1 TABLET BY MOUTH ONCE DAILY, SWALLOW  WHOLE, Disp: 90 tablet, Rfl: 0   atorvastatin (LIPITOR) 80 MG tablet, TAKE 1 TABLET BY MOUTH AT BEDTIME, Disp: 90 tablet, Rfl: 0   Bempedoic Acid-Ezetimibe  (NEXLIZET) 180-10 MG TABS, Take 1 tablet by mouth daily., Disp: 90 tablet, Rfl: 0   clopidogrel (PLAVIX) 75 MG tablet, TAKE 1 TABLET BY MOUTH ONCE DAILY . APPOINTMENT REQUIRED FOR FUTURE REFILLS, Disp: 90 tablet, Rfl: 1   metoprolol succinate (TOPROL XL) 25 MG 24 hr tablet, Take 1 tablet (25 mg total) by mouth in the morning., Disp: 90 tablet, Rfl: 0   omeprazole (PRILOSEC) 20 MG capsule, Take 1 capsule (20 mg total) by mouth daily as  needed for up to 30 doses., Disp: 30 capsule, Rfl: 0   tamsulosin (FLOMAX) 0.4 MG CAPS capsule, Take 0.4 mg by mouth daily., Disp: , Rfl:   Orders Placed This Encounter  Procedures   Lipid Panel With LDL/HDL Ratio   LDL cholesterol, direct   ZOX09+[id number]   EKG 12-Lead   There are no Patient Instructions on file for this visit.   --Continue cardiac medications as reconciled in final medication list. --Return in about 1 year (around 07/23/2023) for Follow up, CAD. Or sooner if needed. --Continue follow-up with your primary care physician regarding the management of your other chronic comorbid conditions.  Patient's questions and concerns were addressed to his satisfaction. He voices understanding of the instructions provided during this encounter.   This note was created using a voice recognition software as a result there may be grammatical errors inadvertently enclosed that do not reflect the nature of this encounter. Every attempt is made to correct such errors.  Tessa Lerner, [place], [facility]  Pager:  [phone] Office: [phone]

## 2022-07-29 ENCOUNTER — Other Ambulatory Visit: Payer: Self-pay

## 2022-07-29 DIAGNOSIS — Z951 Presence of aortocoronary bypass graft: Secondary | ICD-10-CM

## 2022-07-29 DIAGNOSIS — I2510 Atherosclerotic heart disease of native coronary artery without angina pectoris: Secondary | ICD-10-CM

## 2022-07-29 MED ORDER — NEXLIZET 180-10 MG PO TABS
1.0000 | ORAL_TABLET | Freq: Every day | ORAL | 0 refills | Status: DC
Start: 2022-07-29 — End: 2023-01-18

## 2022-08-05 ENCOUNTER — Other Ambulatory Visit: Payer: Self-pay | Admitting: Cardiology

## 2022-08-05 DIAGNOSIS — Z951 Presence of aortocoronary bypass graft: Secondary | ICD-10-CM

## 2022-08-05 DIAGNOSIS — I2510 Atherosclerotic heart disease of native coronary artery without angina pectoris: Secondary | ICD-10-CM

## 2022-08-06 ENCOUNTER — Ambulatory Visit: Payer: Federal, State, Local not specified - PPO | Admitting: Cardiology

## 2022-08-10 DIAGNOSIS — R351 Nocturia: Secondary | ICD-10-CM | POA: Diagnosis not present

## 2022-08-28 DIAGNOSIS — R3911 Hesitancy of micturition: Secondary | ICD-10-CM | POA: Diagnosis not present

## 2022-08-28 DIAGNOSIS — R3912 Poor urinary stream: Secondary | ICD-10-CM | POA: Diagnosis not present

## 2022-09-28 DIAGNOSIS — I2510 Atherosclerotic heart disease of native coronary artery without angina pectoris: Secondary | ICD-10-CM | POA: Diagnosis not present

## 2022-09-28 DIAGNOSIS — Z951 Presence of aortocoronary bypass graft: Secondary | ICD-10-CM | POA: Diagnosis not present

## 2022-09-29 LAB — LIPID PANEL WITH LDL/HDL RATIO
Cholesterol, Total: 103 mg/dL (ref 100–199)
HDL: 46 mg/dL (ref >39)
LDL Chol Calc (NIH): 44 mg/dL (ref 0–99)
LDL/HDL Ratio: 1 ratio (ref 0.0–3.6)
Triglycerides: 53 mg/dL (ref 0–149)
VLDL Cholesterol Cal: 13 mg/dL (ref 5–40)

## 2022-09-29 LAB — CMP14+EGFR
ALT: 20 IU/L (ref 0–44)
AST: 26 IU/L (ref 0–40)
Albumin: 4.4 g/dL (ref 3.9–4.9)
Alkaline Phosphatase: 78 IU/L (ref 44–121)
BUN/Creatinine Ratio: 13 (ref 10–24)
BUN: 17 mg/dL (ref 8–27)
Bilirubin Total: 0.7 mg/dL (ref 0.0–1.2)
CO2: 24 mmol/L (ref 20–29)
Calcium: 9 mg/dL (ref 8.6–10.2)
Chloride: 102 mmol/L (ref 96–106)
Creatinine, Ser: 1.29 mg/dL — ABNORMAL HIGH (ref 0.76–1.27)
Globulin, Total: 2.6 g/dL (ref 1.5–4.5)
Glucose: 102 mg/dL — ABNORMAL HIGH (ref 70–99)
Potassium: 4.6 mmol/L (ref 3.5–5.2)
Sodium: 139 mmol/L (ref 134–144)
Total Protein: 7 g/dL (ref 6.0–8.5)
eGFR: 62 mL/min/{1.73_m2} (ref >59)

## 2022-09-29 LAB — LDL CHOLESTEROL, DIRECT: LDL Direct: 46 mg/dL (ref 0–99)

## 2022-09-30 ENCOUNTER — Telehealth: Payer: Self-pay | Admitting: Cardiology

## 2022-09-30 ENCOUNTER — Other Ambulatory Visit: Payer: Self-pay | Admitting: Cardiology

## 2022-09-30 DIAGNOSIS — Z951 Presence of aortocoronary bypass graft: Secondary | ICD-10-CM

## 2022-09-30 DIAGNOSIS — I2510 Atherosclerotic heart disease of native coronary artery without angina pectoris: Secondary | ICD-10-CM

## 2022-09-30 NOTE — Telephone Encounter (Signed)
Wife called and said that medication could not be filled. Spoke with pharmacy and patients Plavix and Asprin are ready to be picked up. LMOVM

## 2022-10-18 ENCOUNTER — Other Ambulatory Visit: Payer: Self-pay | Admitting: Cardiology

## 2022-10-18 DIAGNOSIS — I2510 Atherosclerotic heart disease of native coronary artery without angina pectoris: Secondary | ICD-10-CM

## 2022-10-18 DIAGNOSIS — Z951 Presence of aortocoronary bypass graft: Secondary | ICD-10-CM

## 2022-11-11 DIAGNOSIS — R3911 Hesitancy of micturition: Secondary | ICD-10-CM | POA: Diagnosis not present

## 2022-11-11 DIAGNOSIS — N401 Enlarged prostate with lower urinary tract symptoms: Secondary | ICD-10-CM | POA: Diagnosis not present

## 2022-11-11 DIAGNOSIS — R351 Nocturia: Secondary | ICD-10-CM | POA: Diagnosis not present

## 2022-11-11 DIAGNOSIS — R3912 Poor urinary stream: Secondary | ICD-10-CM | POA: Diagnosis not present

## 2022-11-20 DIAGNOSIS — I2510 Atherosclerotic heart disease of native coronary artery without angina pectoris: Secondary | ICD-10-CM | POA: Diagnosis not present

## 2022-11-20 DIAGNOSIS — I10 Essential (primary) hypertension: Secondary | ICD-10-CM | POA: Diagnosis not present

## 2022-11-20 DIAGNOSIS — E782 Mixed hyperlipidemia: Secondary | ICD-10-CM | POA: Diagnosis not present

## 2023-01-16 ENCOUNTER — Other Ambulatory Visit: Payer: Self-pay | Admitting: Cardiology

## 2023-01-16 DIAGNOSIS — I2510 Atherosclerotic heart disease of native coronary artery without angina pectoris: Secondary | ICD-10-CM

## 2023-01-16 DIAGNOSIS — Z951 Presence of aortocoronary bypass graft: Secondary | ICD-10-CM

## 2023-01-18 ENCOUNTER — Other Ambulatory Visit: Payer: Self-pay | Admitting: Cardiology

## 2023-01-18 DIAGNOSIS — I2510 Atherosclerotic heart disease of native coronary artery without angina pectoris: Secondary | ICD-10-CM

## 2023-01-18 DIAGNOSIS — Z951 Presence of aortocoronary bypass graft: Secondary | ICD-10-CM

## 2023-03-26 ENCOUNTER — Other Ambulatory Visit (HOSPITAL_COMMUNITY): Payer: Self-pay

## 2023-03-26 ENCOUNTER — Telehealth: Payer: Self-pay | Admitting: Cardiology

## 2023-03-26 ENCOUNTER — Telehealth: Payer: Self-pay | Admitting: Pharmacy Technician

## 2023-03-26 NOTE — Telephone Encounter (Signed)
Pharmacy Patient Advocate Encounter   Received notification from Pt Calls Messages that prior authorization for nexlizet is required/requested.   Insurance verification completed.   The patient is insured through [employer] .   Per test claim: The current 03/26/23 day co-pay is, $268.60- one month.  No PA needed at this time. This test claim was processed through [facility]- copay amounts may vary at other pharmacies due to pharmacy/plan contracts, or as the patient moves through the different stages of their insurance plan.

## 2023-03-26 NOTE — Telephone Encounter (Signed)
*  STAT* If patient is at the pharmacy, call can be transferred to refill team.   1. Which medications need to be refilled? (please list name of each medication and dose if known)  prior authorization for Nexlizet changing from 0 days to 90 days   2. Would you like to learn more about the convenience, safety, & potential cost savings by using the [facility]?    3. Are you open to using the [facility] (Type [facility]..   4. Which pharmacy/location (including street and city if local pharmacy) is medication to be sent to?[facility]   5. Do they need a 30 day or 90 day supply? 90 days and refills

## 2023-03-26 NOTE — Telephone Encounter (Addendum)
Pharmacy Patient Advocate Encounter   Received notification from Pt Calls Messages that prior authorization for nexlizet is required/requested.   Insurance verification completed.   The patient is insured through [employer] .   Per test claim: PA required; PA submitted to above mentioned insurance via CoverMyMeds Key/confirmation #/EOC [id number] Status is pending

## 2023-03-29 ENCOUNTER — Other Ambulatory Visit: Payer: Self-pay

## 2023-03-29 ENCOUNTER — Other Ambulatory Visit (HOSPITAL_COMMUNITY): Payer: Self-pay

## 2023-03-29 DIAGNOSIS — I2510 Atherosclerotic heart disease of native coronary artery without angina pectoris: Secondary | ICD-10-CM

## 2023-03-29 DIAGNOSIS — Z951 Presence of aortocoronary bypass graft: Secondary | ICD-10-CM

## 2023-03-29 MED ORDER — NEXLIZET 180-10 MG PO TABS: 1.0000 | ORAL_TABLET | Freq: Every day | ORAL | 0 refills | Status: DC

## 2023-04-02 ENCOUNTER — Other Ambulatory Visit (HOSPITAL_COMMUNITY): Payer: Self-pay

## 2023-04-06 ENCOUNTER — Other Ambulatory Visit (HOSPITAL_COMMUNITY): Payer: Self-pay

## 2023-04-06 NOTE — Telephone Encounter (Signed)
 Pharmacy Patient Advocate Encounter  Received notification from [facility] that Prior Authorization for nexlizet  has been approved.   Unable to get a test claim due to refill too soon. Last filled on 03/21/23. Next available fill 04/13/23 PA #/Case ID/Reference #: [id number]

## 2023-04-14 ENCOUNTER — Ambulatory Visit
Admission: EM | Admit: 2023-04-14 | Discharge: 2023-04-14 | Disposition: A | Discharge status: Home / Self Care | Payer: Federal, State, Local not specified - PPO | Attending: Emergency Medicine | Admitting: Emergency Medicine

## 2023-04-14 DIAGNOSIS — J069 Acute upper respiratory infection, unspecified: Secondary | ICD-10-CM | POA: Diagnosis not present

## 2023-04-14 MED ORDER — IPRATROPIUM BROMIDE 0.06 % NA SOLN: 2.0000 | Freq: Four times a day (QID) | NASAL | 12 refills | Status: AC

## 2023-04-14 MED ORDER — PROMETHAZINE-DM 6.25-15 MG/5ML PO SYRP: 5.0000 mL | ORAL_SOLUTION | Freq: Four times a day (QID) | ORAL | 0 refills | Status: AC | PRN

## 2023-04-14 MED ORDER — BENZONATATE 100 MG PO CAPS: 200.0000 mg | ORAL_CAPSULE | Freq: Three times a day (TID) | ORAL | 0 refills | Status: AC

## 2023-04-14 NOTE — ED Triage Notes (Signed)
 Pt c/o cough,chills,bodyaches & congestion x5 days.

## 2023-04-14 NOTE — ED Provider Notes (Signed)
 [facility]    CSN: [id number] Arrival date & time: 04/14/23  1153      History   Chief Complaint Chief Complaint  Patient presents with   Cough   Nasal Congestion   Chills   Generalized Body Aches    HPI Brian Curry is a 63 y.o. male.   HPI  63 year old male with past medical history significant for hypertension, hyperlipidemia, GERD, CAD, and BPH presents for evaluation of respiratory symptoms that began 5 days ago.  These include sweats and chills, subjective fever, nasal congestion with nasal discharge that was initially dark brown and now is clear.  His cough is occasionally productive for a brown sputum but is mostly nonproductive.  No associated shortness of breath or wheezing.  Also no sore throat or ear pain.  Patient is concerned about possible bronchitis.  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   CAD (coronary artery disease) 07/01/2020   Anginal equivalent (HCC)    Precordial pain     Past Surgical History:  Procedure Laterality Date   COLONOSCOPY     CORONARY ARTERY BYPASS GRAFT N/A 07/09/2020   Procedure: CORONARY ARTERY BYPASS GRAFTING (CABG) X TWO USING LEFT INTERNAL MAMMARY ARTERY AND LEFT RADIAL ARTERY;  Surgeon: Hilarie Lovely, MD;  Location: [facility] OR;  Service: Open Heart Surgery;  Laterality: N/A;   HERNIA REPAIR     LEFT HEART CATH AND CORONARY ANGIOGRAPHY N/A 07/02/2020   Procedure: LEFT HEART CATH AND CORONARY ANGIOGRAPHY;  Surgeon: Cody Das, MD;  Location: [facility] INVASIVE [facility];  Service: Cardiovascular;  Laterality: N/A;   RADIAL ARTERY HARVEST Left 07/09/2020   Procedure: RADIAL ARTERY HARVEST;  Surgeon: Hilarie Lovely, MD;  Location: [facility] OR;  Service: Open Heart Surgery;  Laterality:  Left;   TEE WITHOUT CARDIOVERSION N/A 07/09/2020   Procedure: TRANSESOPHAGEAL ECHOCARDIOGRAM (TEE);  Surgeon: Hilarie Lovely, MD;  Location: [facility] OR;  Service: Open Heart Surgery;  Laterality: N/A;   WISDOM TOOTH EXTRACTION         Home Medications    Prior to Admission medications   Medication Sig Start Date End Date Taking? Authorizing Provider  amLODipine  (NORVASC ) 5 MG tablet Take 1 tablet (5 mg total) by mouth daily. 07/13/20  Yes Von Grumbling, PA-C  aspirin  EC (ASPIRIN  LOW DOSE) 81 MG tablet TAKE 1 TABLET BY MOUTH ONCE DAILY SWALLOW  WHOLE. 09/30/22  Yes Tolia, Sunit, DO  atorvastatin  (LIPITOR ) 80 MG tablet TAKE 1 TABLET BY MOUTH AT BEDTIME 12/16/20  Yes Tolia, Sunit, DO  Bempedoic Acid-Ezetimibe (NEXLIZET ) 180-10 MG TABS Take 1 tablet by mouth daily. 03/29/23  Yes Tolia, Sunit, DO  benzonatate  (TESSALON ) 100 MG capsule Take 2 capsules (200 mg total) by mouth every 8 (eight) hours. 04/14/23  Yes Kent Pear, NP  clopidogrel  (PLAVIX ) 75 MG tablet Take 1 tablet (75 mg total) by mouth daily. 09/30/22  Yes Tolia, Sunit, DO  ipratropium (ATROVENT ) 0.06 % nasal spray Place 2 sprays into both nostrils 4 (four) times daily. 04/14/23  Yes Kent Pear, NP  metoprolol  succinate (TOPROL -XL) 25 MG 24 hr tablet TAKE 1 TABLET BY MOUTH IN THE MORNING 01/19/23  Yes Tolia, Sunit, DO  omeprazole  (PRILOSEC) 20 MG capsule  Take 1 capsule (20 mg total) by mouth daily as needed for up to 30 doses. 10/17/20  Yes Tolia, Sunit, DO  promethazine -dextromethorphan (PROMETHAZINE -DM) 6.25-15 MG/5ML syrup Take 5 mLs by mouth 4 (four) times daily as needed. 04/14/23  Yes Kent Pear, NP  tamsulosin (FLOMAX) 0.4 MG CAPS capsule Take 0.4 mg by mouth daily. 03/23/19  Yes [provider]    Family History Family History  Problem Relation Age of Onset   Heart attack Mother    Hypertension Mother    Hypertension Father    Heart attack Father    Stroke Father     Social History Social History   Tobacco  Use   Smoking status: Never   Smokeless tobacco: Never  Vaping Use   Vaping status: Never Used  Substance Use Topics   Alcohol use: Yes    Comment: occasional   Drug use: Never     Allergies   Patient has no known allergies.   Review of Systems Review of Systems  Constitutional:  Positive for chills, diaphoresis and fever.  HENT:  Positive for congestion and rhinorrhea. Negative for ear pain and sore throat.   Respiratory:  Positive for cough. Negative for shortness of breath and wheezing.      Physical Exam Triage Vital Signs ED Triage Vitals [04/14/23 1218]  Encounter Vitals Group     BP      Systolic BP Percentile      Diastolic BP Percentile      Pulse      Resp 16     Temp      Temp Source Oral     SpO2      Weight      Height      Head Circumference      Peak Flow      Pain Score      Pain Loc      Pain Education      Exclude from Growth Chart    No data found.  Updated Vital Signs BP (!) 130/90 (BP Location: Left Arm)   Pulse 69   Temp 99 F (37.2 C) (Oral)   Resp 16   Ht 5\' 8"  (1.727 m)   Wt 175 lb (79.4 kg)   SpO2 99%   BMI 26.61 kg/m   Visual Acuity Right Eye Distance:   Left Eye Distance:   Bilateral Distance:    Right Eye Near:   Left Eye Near:    Bilateral Near:     Physical Exam Vitals and nursing note reviewed.  Constitutional:      Appearance: Normal appearance. He is not ill-appearing.  HENT:     Head: Normocephalic and atraumatic.     Right Ear: Tympanic membrane, ear canal and external ear normal. There is no impacted cerumen.     Left Ear: Tympanic membrane, ear canal and external ear normal. There is no impacted cerumen.     Nose: Congestion and rhinorrhea present.     Comments: Nasal mucosa is edematous and erythematous with scant clear discharge in both nares.    Mouth/Throat:     Mouth: Mucous membranes are moist.     Pharynx: Oropharynx is clear. No oropharyngeal exudate or posterior oropharyngeal erythema.   Cardiovascular:     Rate and Rhythm: Normal rate and regular rhythm.     Pulses: Normal pulses.     Heart sounds: Normal heart sounds. No murmur heard.    No friction rub. No gallop.  Pulmonary:  Effort: Pulmonary effort is normal.     Breath sounds: Normal breath sounds. No wheezing, rhonchi or rales.  Musculoskeletal:     Cervical back: Normal range of motion and neck supple. No tenderness.  Lymphadenopathy:     Cervical: No cervical adenopathy.  Skin:    General: Skin is warm and dry.     Capillary Refill: Capillary refill takes less than 2 seconds.     Findings: No rash.  Neurological:     General: No focal deficit present.     Mental Status: He is alert and oriented to person, place, and time.      UC Treatments / Results  Labs (all labs ordered are listed, but only abnormal results are displayed) Labs Reviewed - No data to display  EKG   Radiology No results found.  Procedures Procedures (including critical care time)  Medications Ordered in UC Medications - No data to display  Initial Impression / Assessment and Plan / UC Course  I have reviewed the triage vital signs and the nursing notes.  Pertinent labs & imaging results that were available during my care of the patient were reviewed by me and considered in my medical decision making (see chart for details).   Patient is a pleasant, nontoxic-appearing 63 year old male presenting for evaluation of 5 days worth of respiratory symptoms as outlined HPI above.  His physical exam does reveal inflammation of his upper respiratory tract but his rhinorrhea is clear.  Oropharyngeal exam is benign.  Cardiopulmonary exam reveals clear lung sounds in all fields.  Differential diagnosis include COVID, influenza, viral respiratory illness.  Given that he is on day 5 of symptoms I will not test him for COVID or influenza at this time as he is outside of the therapeutic window for antivirals.  I will discharge him home with  a diagnosis of viral URI with a cough with a prescription for Atrovent  nasal spray, Tessalon  Perles, Promethazine  DM cough syrup.  Tylenol   as needed for fever or pain.  Return precautions reviewed.  Work note provided.   Final Clinical Impressions(s) / UC Diagnoses   Final diagnoses:  Viral URI with cough     Discharge Instructions      You been diagnosed with a viral respiratory illness today.  Use over-the-counter Tylenol  according the package instructions as needed for fever or bodyaches.  Use the Atrovent  nasal spray, 2 squirts in each nostril every 6 hours, as needed for runny nose and postnasal drip.  Use the Tessalon  Perles every 8 hours during the day.  Take them with a small sip of water.  They may give you some numbness to the base of your tongue or a metallic taste in your mouth, this is normal.  Use the Promethazine  DM cough syrup at bedtime for cough and congestion.  It will make you drowsy so do not take it during the day.  Return for reevaluation or see your primary care provider for any new or worsening symptoms.      ED Prescriptions     Medication Sig Dispense Auth. Provider   benzonatate  (TESSALON ) 100 MG capsule Take 2 capsules (200 mg total) by mouth every 8 (eight) hours. 21 capsule Kent Pear, NP   ipratropium (ATROVENT ) 0.06 % nasal spray Place 2 sprays into both nostrils 4 (four) times daily. 15 mL Kent Pear, NP   promethazine -dextromethorphan (PROMETHAZINE -DM) 6.25-15 MG/5ML syrup Take 5 mLs by mouth 4 (four) times daily as needed. 118 mL Kent Pear, NP  PDMP not reviewed this encounter.   Kent Pear, NP 04/14/23 1249

## 2023-04-14 NOTE — Discharge Instructions (Signed)
 You been diagnosed with a viral respiratory illness today.  Use over-the-counter Tylenol  according the package instructions as needed for fever or bodyaches.  Use the Atrovent  nasal spray, 2 squirts in each nostril every 6 hours, as needed for runny nose and postnasal drip.  Use the Tessalon  Perles every 8 hours during the day.  Take them with a small sip of water.  They may give you some numbness to the base of your tongue or a metallic taste in your mouth, this is normal.  Use the Promethazine  DM cough syrup at bedtime for cough and congestion.  It will make you drowsy so do not take it during the day.  Return for reevaluation or see your primary care provider for any new or worsening symptoms.

## 2023-04-19 ENCOUNTER — Other Ambulatory Visit: Payer: Self-pay | Admitting: Cardiology

## 2023-04-19 DIAGNOSIS — Z951 Presence of aortocoronary bypass graft: Secondary | ICD-10-CM

## 2023-04-19 DIAGNOSIS — I2510 Atherosclerotic heart disease of native coronary artery without angina pectoris: Secondary | ICD-10-CM

## 2023-04-20 ENCOUNTER — Telehealth: Payer: Self-pay

## 2023-04-20 ENCOUNTER — Telehealth: Payer: Self-pay | Admitting: Cardiology

## 2023-04-20 ENCOUNTER — Other Ambulatory Visit: Payer: Self-pay

## 2023-04-20 DIAGNOSIS — Z951 Presence of aortocoronary bypass graft: Secondary | ICD-10-CM

## 2023-04-20 DIAGNOSIS — I2510 Atherosclerotic heart disease of native coronary artery without angina pectoris: Secondary | ICD-10-CM

## 2023-04-20 MED ORDER — NEXLIZET 180-10 MG PO TABS: 1.0000 | ORAL_TABLET | Freq: Every day | ORAL | 3 refills | Status: DC

## 2023-04-20 NOTE — Telephone Encounter (Signed)
Pt c/o medication issue:  1. Name of Medication:   Bempedoic Acid-Ezetimibe (NEXLIZET) 180-10 MG TABS    2. How are you currently taking this medication (dosage and times per day)?  Take 1 tablet by mouth daily.       3. Are you having a reaction (difficulty breathing--STAT)? No  4. What is your medication issue? Pt's wife is requesting a callback regarding a refill approval on this medication. She stated the request was sent over to the office from [employer] order program that's apart of [employer] mail order program. The phone number is [phone] and fax [phone]. She'd like to discuss further once called back, pt needs his medication as soon as possible. Please advise.

## 2023-04-20 NOTE — Telephone Encounter (Signed)
Spoke with pt's wife (per [facility] DPR on file) and she stated when she [facility] that they stated they don't see the Nexlizet refill that I had sent today at 12:21 pm. Pt's wife asked that I call and see if I can get some more information. After speaking to a pharmacy tech, I was told that it can take 24-48 hours after a prescription has been sent for them to be able to see it. The pt and the pt's wife will be leaving to go out of town for a total of 14 days on 04/26/23 and as of today, the pt only has 11 doses of Nexlizet left. Explained to pt's wife that I will forward this to our PharmD to see what they recommend. Pt's wife verbalized understanding of plan.

## 2023-04-20 NOTE — Telephone Encounter (Signed)
Spoke with pt's wife (per [facility] DPR on file- pt has not been to [facility] office yet) and she stated that the pt needs a refill for Nexlizet and needs it to be sent as a 90 day supply [facility]. Refill has been sent and pt's wife is aware. Pt's wife told to call us back with any issues or concerns.

## 2023-04-20 NOTE — Progress Notes (Signed)
Pt's wife stated that the pt is out of refills and needs a refill [facility] for [facility] day supply. Refill has been sent to pharmacy. Pt's wife is aware.

## 2023-04-21 ENCOUNTER — Other Ambulatory Visit: Payer: Self-pay

## 2023-04-21 DIAGNOSIS — I2510 Atherosclerotic heart disease of native coronary artery without angina pectoris: Secondary | ICD-10-CM

## 2023-04-21 DIAGNOSIS — Z951 Presence of aortocoronary bypass graft: Secondary | ICD-10-CM

## 2023-04-21 MED ORDER — NEXLIZET 180-10 MG PO TABS: 1.0000 | ORAL_TABLET | Freq: Every day | ORAL | 1 refills | Status: DC

## 2023-04-21 NOTE — Progress Notes (Signed)
Pt's wife is concerned about not getting pt's refill for Nexlizet in time before they go out of town. PharmD recommended that they can get it filled local instead of through the mail order for the time being. Pt's wife made aware of this option and decided to do that. New refill sent to [facility] in [place]. Pt's wife is aware.

## 2023-04-21 NOTE — Telephone Encounter (Signed)
If they won't get it before they leave town, they can get it filled locally

## 2023-04-22 DIAGNOSIS — N401 Enlarged prostate with lower urinary tract symptoms: Secondary | ICD-10-CM | POA: Diagnosis not present

## 2023-04-22 DIAGNOSIS — R3912 Poor urinary stream: Secondary | ICD-10-CM | POA: Diagnosis not present

## 2023-04-22 DIAGNOSIS — R3911 Hesitancy of micturition: Secondary | ICD-10-CM | POA: Diagnosis not present

## 2023-04-22 DIAGNOSIS — R351 Nocturia: Secondary | ICD-10-CM | POA: Diagnosis not present

## 2023-04-23 ENCOUNTER — Telehealth: Payer: Self-pay | Admitting: Cardiology

## 2023-04-23 NOTE — Telephone Encounter (Signed)
   Name: Brian Curry  DOB: 15-Jul-1960  MRN: [id number]  Primary Cardiologist: None   Preoperative team, please contact this patient and set up a phone call appointment for further preoperative risk assessment. Please obtain consent and complete medication review. Thank you for your help.  I confirm that guidance regarding antiplatelet and oral anticoagulation therapy has been completed and, if necessary, noted below.  Regarding ASA therapy, we recommend continuation of ASA throughout the perioperative period.  However, if the surgeon feels that cessation of ASA is required in the perioperative period, it may be stopped 5-7 days prior to surgery with a plan to resume it as soon as felt to be feasible from a surgical standpoint in the post-operative period.  I also confirmed the patient resides in the state of [place]. As per [facility] Medical Board telemedicine laws, the patient must reside in the state in which the provider is licensed.   Joylene Grapes, NP 04/23/2023, 2:03 PM [facility]

## 2023-04-23 NOTE — Telephone Encounter (Signed)
   Pre-operative Risk Assessment    Patient Name: Brian Curry  DOB: [birth year] MRN: [id number]   Date of last office visit: 07/23/22 Date of next office visit: N/A   Request for Surgical Clearance    Procedure:   TURP  Date of Surgery:  Clearance 06/16/23                                Surgeon:  Dr. Alvester Morin Surgeon's Group or Practice Name:  [facility] Phone number:  [phone]. [phone] Fax number:  [phone]   Type of Clearance Requested:   - Pharmacy:  Hold Aspirin 5 Days   Type of Anesthesia:  General    Additional requests/questions:  Please fax a copy of medical clearance to the surgeon's office.  [name]   04/23/2023, 1:50 PM

## 2023-04-23 NOTE — Telephone Encounter (Signed)
1st attempt to reach pt regarding surgical clearance and the need for a TELE appointment. Unable to LVM as voicemail box has not yet been set up.

## 2023-04-27 NOTE — Telephone Encounter (Signed)
2nd attempt to schedule televisit for preop clearance. LVMFCB

## 2023-04-28 NOTE — Telephone Encounter (Signed)
Pt. Did not answer, will try again later.

## 2023-04-28 NOTE — Telephone Encounter (Signed)
Pt returning call, requesting cb

## 2023-05-03 NOTE — Telephone Encounter (Signed)
Tried calling patient no answer left a detailed message

## 2023-05-03 NOTE — Telephone Encounter (Signed)
 Patient was returning call. Please advise ?

## 2023-05-04 ENCOUNTER — Telehealth: Payer: Self-pay | Admitting: Cardiology

## 2023-05-04 NOTE — Telephone Encounter (Signed)
Left message to call back to set up tele pre op appt.  

## 2023-05-04 NOTE — Telephone Encounter (Signed)
Pt returning call regarding Pre-Op appt. Please advise 

## 2023-05-04 NOTE — Telephone Encounter (Deleted)
Pt returning call regarding Pre Op appt. Please advise      Note   Brian Curry, Brian Curry [phone]  [name]    Pt has new pt appt 05/06/23 with Dr. Dulce Sellar

## 2023-05-05 NOTE — Telephone Encounter (Signed)
 Pt has an appt in office with Dr. Albert Huff 05/27/23. I will update all parties involved.

## 2023-05-05 NOTE — Telephone Encounter (Signed)
Brian Curry,   To clarify as there is a lot of back and forth - you would like me to address this at the upcoming office visit.   Brian Drum [place], DO, [facility]

## 2023-05-06 NOTE — Telephone Encounter (Signed)
 Good morning Dr. Michele, to answer your question, yes please address preop clearance at the upcoming appt 05/27/23. Looks like pt is scheduled for a f/u with you based on your last ov notes. Our first tele appt is not available until 05/19/23, which is only 1 week sooner than the appt with you on 05/27/23. Procedure is TBD.

## 2023-05-18 ENCOUNTER — Other Ambulatory Visit: Payer: Self-pay

## 2023-05-18 ENCOUNTER — Other Ambulatory Visit (HOSPITAL_COMMUNITY): Payer: Self-pay

## 2023-05-18 ENCOUNTER — Telehealth: Payer: Self-pay | Admitting: Pharmacy Technician

## 2023-05-18 ENCOUNTER — Telehealth: Payer: Self-pay | Admitting: Cardiology

## 2023-05-18 DIAGNOSIS — I2510 Atherosclerotic heart disease of native coronary artery without angina pectoris: Secondary | ICD-10-CM

## 2023-05-18 DIAGNOSIS — Z951 Presence of aortocoronary bypass graft: Secondary | ICD-10-CM

## 2023-05-18 MED ORDER — NEXLIZET 180-10 MG PO TABS: 1.0000 | ORAL_TABLET | Freq: Every day | ORAL | 3 refills | Status: DC

## 2023-05-18 NOTE — Telephone Encounter (Signed)
Spoke with pt's wife and she stated the pt is ready to refill his Nexlizet but there had been an issue previously where the pt was labeled to have [employer] and so the Nexlizet was ordered under [employer] however the pt has [employer]- [employer] and it needs to be ordered/ filed under [employer] this time for a [facility] day supply [facility]. Explained to pt's wife that we will send this information to PharmD and rx assistance to see if they can help [place] with this change.

## 2023-05-18 NOTE — Progress Notes (Signed)
[facility]

## 2023-05-18 NOTE — Telephone Encounter (Signed)
I just did a test claim and it went through on the [employer] for 74.93 for 30 days at our pharmacy. It also went through on the [place] insurance that she said not to use for 279.38 for 30 days.

## 2023-05-18 NOTE — Telephone Encounter (Signed)
Pt c/o medication issue:  1. Name of Medication: Bempedoic Acid-Ezetimibe (NEXLIZET) 180-10 MG TABS   2. How are you currently taking this medication (dosage and times per day)? N/A  3. Are you having a reaction (difficulty breathing--STAT)? No   4. What is your medication issue? Patient's spouse is calling requesting to speak with Harlow Ohms in regards to PA for this medication as well as ensuring pt is enrolled in the correct program. Please advise.

## 2023-05-18 NOTE — Telephone Encounter (Signed)
Patient's wife returned call.  

## 2023-05-18 NOTE — Telephone Encounter (Signed)
The last I see is nexlizet that was sent on 04/21/2023 for 30 days with 1 refill with a note saying: "to schedule an yearly appointment with Dr. Odis Hollingshead for April 2025 before anymore refills". If you guys want to do 90 days and she wants [facility], then someone can send a refill to [facility] for 90 days. She would have to provide [facility] with the correct insurance information, nothing on the medication assistance side for [place] to do. Thank you   [place], Gerald Leitz, RN routed conversation to [employer]; Rx Med Assistance Team1 hour ago (3:06 PM)   [place], Gerald Leitz, RN1 hour ago (3:06 PM)    Spoke with pt's wife and she stated the pt is ready to refill his Nexlizet but there had been an issue previously where the pt was labeled to have [employer] and so the Nexlizet was ordered under [employer] however the pt has [employer]- [employer] and it needs to be ordered/ filed under [employer] this time for a [facility] day supply [facility]. Explained to pt's wife that we will send this information to PharmD and rx assistance to see if they can help [place] with this change.

## 2023-05-18 NOTE — Telephone Encounter (Signed)
Attempted to call pt's wife but had to [facility].

## 2023-05-19 MED ORDER — NEXLIZET 180-10 MG PO TABS: 1.0000 | ORAL_TABLET | Freq: Every day | ORAL | 0 refills | Status: DC

## 2023-05-19 NOTE — Addendum Note (Signed)
Addended by: Rosalee Kaufman on: 05/19/2023 02:39 PM   Modules accepted: Orders

## 2023-05-20 ENCOUNTER — Telehealth: Payer: Self-pay | Admitting: Cardiology

## 2023-05-20 NOTE — Telephone Encounter (Signed)
Pt c/o medication issue:  1. Name of Medication:   Bempedoic Acid-Ezetimibe (NEXLIZET) 180-10 MG TABS    2. How are you currently taking this medication (dosage and times per day)? As written  3. Are you having a reaction (difficulty breathing--STAT)? no  4. What is your medication issue? Script was not sent to correct pharmacy. Wife would like a call back

## 2023-05-27 ENCOUNTER — Ambulatory Visit: Payer: Federal, State, Local not specified - PPO | Attending: Cardiology | Admitting: Cardiology

## 2023-05-27 VITALS — BP 120/74 | HR 63 | Resp 16 | Ht 68.0 in | Wt 175.0 lb

## 2023-05-27 DIAGNOSIS — Z01818 Encounter for other preprocedural examination: Secondary | ICD-10-CM

## 2023-05-27 DIAGNOSIS — E782 Mixed hyperlipidemia: Secondary | ICD-10-CM

## 2023-05-27 DIAGNOSIS — Z8249 Family history of ischemic heart disease and other diseases of the circulatory system: Secondary | ICD-10-CM

## 2023-05-27 DIAGNOSIS — Z951 Presence of aortocoronary bypass graft: Secondary | ICD-10-CM | POA: Diagnosis not present

## 2023-05-27 DIAGNOSIS — I2510 Atherosclerotic heart disease of native coronary artery without angina pectoris: Secondary | ICD-10-CM | POA: Diagnosis not present

## 2023-05-27 DIAGNOSIS — I10 Essential (primary) hypertension: Secondary | ICD-10-CM | POA: Diagnosis not present

## 2023-05-27 NOTE — Progress Notes (Signed)
 Cardiology Office Note:  .     ID:  Brian Curry, DOB 1960-05-24, MRN [id number] PCP:  Aliene Beams, MD  Former Cardiology Providers: NA  HeartCare Providers Cardiothoracic surgeon: Dr. Cliffton Asters  Cardiologist:  Tessa Lerner, DO , [facility] (established care 06/06/2020 ) Electrophysiologist:  None  Click to update primary MD,subspecialty MD or APP then REFRESH:1}    Chief Complaint  Patient presents with   Atherosclerosis of native coronary artery of native heart w   Pre-op Exam   Follow-up    History of Present Illness: .   Brian Curry is a 63 y.o. African-American male whose past medical history and cardiovascular risk factors includes: Multivessel CAD, status post two-vessel CABG (LIMA to the LAD, left radial to OM1), hypertension, anxiety, BPH, obesity due to excess calories, mixed hyperlipidemia, bilateral carpal tunnel.   Patient presents today for CAD follow-up as well as preoperative risk stratification.  In March 2022 he had symptoms of precordial pain which is ongoing and he was told this likely heartburn. But given his family history of CAD and his risk factors he underwent ischemic workup as outlined below and eventually on July 09, 2020 had a two-vessel bypass with Dr. Cliffton Asters.   Patient presents today for almost 1 year follow-up visit given his history of CAD status post surgical revascularization.  From a cardiology standpoint he denies any anginal chest pain or heart failure symptoms.  He is walking at least 6 miles per day or more.  No change in physical endurance.  However, he has noticed that at times his home blood pressures are greater than 140 mmHg.  However when he checks it at the clinics the blood pressures are usually very well-controlled.  This does raise the concern for possible masked hypertension versus faulty equipment.  Patient is also being considered for TURP procedure in March 2025.  Family hx of CAD mom and dad. Dad had MI  around age of 93 and Mom had MI around 32.   Review of Systems: .   Review of Systems  Cardiovascular:  Negative for chest pain, claudication, irregular heartbeat, leg swelling, near-syncope, orthopnea, palpitations, paroxysmal nocturnal dyspnea and syncope.  Respiratory:  Negative for shortness of breath.   Hematologic/Lymphatic: Negative for bleeding problem.    Studies Reviewed:   EKG: EKG Interpretation Date/Time:  Thursday May 27 2023 10:29:55 EST Ventricular Rate:  54 PR Interval:  158 QRS Duration:  96 QT Interval:  432 QTC Calculation: 409 R Axis:   48  Text Interpretation: Sinus bradycardia Early repolarization When compared with ECG of 10-Jul-2020 07:11, ST less elevated in Lateral leads T wave inversion no longer evident in Inferior leads Nonspecific T wave abnormality no longer evident in Lateral leads Confirmed by Tessa Lerner [phone] on 05/27/2023 10:56:22 AM  Echocardiogram: 06/11/2020:  Left ventricle cavity is normal in size. Mild concentric hypertrophy of the left ventricle. Normal global wall motion. Normal LV systolic function with EF 63%. Doppler evidence of grade I (impaired) diastolic dysfunction, normal LAP.  No significant valvular abnormality.  Normal right atrial pressure.   Stress Testing: No results found for this or any previous visit from the past 1095 days.   Coronary CTA: 06/25/2020 1. Coronary calcium score of 584. This was 97th percentile for age and sex matched control.  2. Normal coronary origin with right dominance. 3. CAD-RADS = 3. Mild stenosis (25-49%) at the distal left main due to mixed plaque. Moderate stenosis (50-69%) at the ostial/proximal LAD due  to mixed plaque. Mild stenosis (25-49%) in the proximal LCx due to calcified plaque. Mild stenosis (25-49%) at the ostial OM1 due to mixed plaque. Mild stenosis (25-49%) in mid RCA due to calcified plaque.  4. Aortic atherosclerosis. 5. Study is sent for CT-FFR to further evaluate the LM  and LAD disease. Findings will be performed and reported separately.   Heart Catheterization: 07/02/2020 LM: Distal 90% stenosis LAD: Ostial-prox 95-90% tandem stenoses LCx: Prox LCx 50% stenosis RCA: Mid 20% stenosis   Excellent surgical targets in LAD/LCx   LVEDP normal  Patient has had chronic stable angina, currently asymptomatic and HD stable. I have consulted CVTS for urgent outpatient consultation. Cautioned the patient to avoid any strenuous physical activity.  RADIOLOGY: NA  Risk Assessment/Calculations:   NA   Labs:       Latest Ref Rng & Units 02/06/2022   12:21 PM 07/12/2020    2:57 AM 07/11/2020    4:47 AM  CBC  WBC 3.4 - 10.8 x10E3/uL 3.7  11.4  12.5   Hemoglobin 13.0 - 17.7 g/dL 11.9  14.7  82.9   Hematocrit 37.5 - 51.0 % 49.9  34.4  34.9   Platelets 150 - 450 x10E3/uL 172  164  154        Latest Ref Rng & Units 09/28/2022   11:22 AM 02/06/2022   12:21 PM 07/12/2020    2:57 AM  BMP  Glucose 70 - 99 mg/dL 562  130  865   BUN 8 - 27 mg/dL 17  15  14    Creatinine 0.76 - 1.27 mg/dL 7.84  6.96  2.95   BUN/Creat Ratio 10 - 24 13  13     Sodium 134 - 144 mmol/L 139  140  132   Potassium 3.5 - 5.2 mmol/L 4.6  4.5  3.4   Chloride 96 - 106 mmol/L 102  102  100   CO2 20 - 29 mmol/L 24  26  26    Calcium 8.6 - 10.2 mg/dL 9.0  9.1  8.4       Latest Ref Rng & Units 09/28/2022   11:22 AM 02/06/2022   12:21 PM 07/12/2020    2:57 AM  CMP  Glucose 70 - 99 mg/dL 284  132  440   BUN 8 - 27 mg/dL 17  15  14    Creatinine 0.76 - 1.27 mg/dL 1.02  7.25  3.66   Sodium 134 - 144 mmol/L 139  140  132   Potassium 3.5 - 5.2 mmol/L 4.6  4.5  3.4   Chloride 96 - 106 mmol/L 102  102  100   CO2 20 - 29 mmol/L 24  26  26    Calcium 8.6 - 10.2 mg/dL 9.0  9.1  8.4   Total Protein 6.0 - 8.5 g/dL 7.0  6.9    Total Bilirubin 0.0 - 1.2 mg/dL 0.7  0.7    Alkaline Phos 44 - 121 IU/L 78  96    AST 0 - 40 IU/L 26  18    ALT 0 - 44 IU/L 20  16      Lab Results  Component Value Date    CHOL 103 09/28/2022   HDL 46 09/28/2022   LDLCALC 44 09/28/2022   LDLDIRECT 46 09/28/2022   TRIG 53 09/28/2022   No results for input(s): "LIPOA" in the last 8760 hours. No components found for: "NTPROBNP" No results for input(s): "PROBNP" in the last 8760 hours. No results for input(s): "TSH" in  the last 8760 hours.  External Labs: Collected:  09/11/2020  Total cholesterol 124, triglycerides 56, HDL 35, LDL 77, non-HDL 89 02/28/2021 Total cholesterol 141, HDL 44, triglycerides 48, LDL 87  Lipid Panel w/reflex Reviewed date:05/27/2022 01:29:37 PM Interpretation:LDL 76/HDL 51 Performing Lab: Notes/Report: Testing Performed at: [facility], [address], [place], [place] [address]  Cholesterol 137 <200 mg/dL    CHOL/HDL 2.7 1.3-2.4 Ratio    HDLD 51 30-70 mg/dL Values below 40 mg/dL indicate increased risk factor  Triglyceride 41 0-199 mg/dL    NHDL 86 4-010 mg/dL Range dependent upon risk factors.  LDL Chol Calc (NIH) 76 0-99 mg/dL      Physical Exam:    Today's Vitals   05/27/23 1027  BP: 120/74  Pulse: 63  Resp: 16  SpO2: 97%  Weight: 175 lb (79.4 kg)  Height: 5\' 8"  (1.727 m)   Body mass index is 26.61 kg/m. Wt Readings from Last 3 Encounters:  05/27/23 175 lb (79.4 kg)  04/14/23 175 lb (79.4 kg)  07/23/22 175 lb (79.4 kg)    Physical Exam  Constitutional: No distress.  hemodynamically stable  Neck: No JVD present.  Cardiovascular: Normal rate, regular rhythm, S1 normal and S2 normal. Exam reveals no gallop, no S3 and no S4.  No murmur heard. Pulmonary/Chest: Effort normal and breath sounds normal. No stridor. He has no wheezes. He has no rales.  Musculoskeletal:        General: No edema.     Cervical back: Neck supple.  Skin: Skin is warm.     Impression & Recommendation(s):  Impression:   ICD-10-CM   1. Preoperative clearance  Z01.818 EKG 12-Lead    2. Atherosclerosis of native coronary artery of native heart without angina pectoris   I25.10 ECHOCARDIOGRAM COMPLETE    Bempedoic Acid-Ezetimibe (NEXLIZET) 180-10 MG TABS    3. S/P CABG (coronary artery bypass graft)  Z95.1 Bempedoic Acid-Ezetimibe (NEXLIZET) 180-10 MG TABS    4. Benign hypertension  I10     5. Mixed hyperlipidemia  E78.2     6. Family history of premature CAD  Z82.49        Recommendation(s):  Preoperative clearance Patient is being considered for TURP procedure in March 2025. He ambulates at least 6 miles per day. No anginal chest pain. Patient is overall considered to be low risk candidate for upcoming procedure. With regards to antiplatelet therapy as discussed below we will stop Plavix 7 days before TURP and restart once cleared by his urologist. Going to hold ASA going forward.  Patient states that he is considering holding off on TURP for now  Atherosclerosis of native coronary artery of native heart without angina pectoris S/P CABG (coronary artery bypass graft) Underwent two-vessel bypass surgery in April 2022. Denies anginal chest pain. EKG today is nonischemic. Overall functional capacity remains excellent. No use of sublingual nitroglycerin tablets since the last office visit.. Continue Lipitor 80 mg p.o. daily. Refill Nexlizet 180/10 mg p.o. daily. Patient has been on dual antiplatelet therapy since his CABG surgery in 2022.  Recommended discontinuation of Aspirin and to continue Plavix 75mg  po qday. Echo will be ordered to evaluate for structural heart disease and left ventricular systolic function.  Benign hypertension Office blood pressures are very well-controlled. Endorses that home blood pressures are not well-controlled. I have asked him to have his home equipment checked with his PCP to make sure that they correlate otherwise may want to reinvestigate a new blood pressure cuff for accuracy.  However if home blood pressures are elevated up titration of medical therapy is very well warranted.  Patient will reach back out to [place]  with additional data  Mixed hyperlipidemia Currently on atorvastatin 80 mg p.o. daily and Nexlizet He denies myalgia or other side effects. Most recent lipids dated July 2024, independently reviewed as noted above.  LDL is 46 mg/dL.  Recommend a goal LDL of <55 mg p.o. daily.   Orders Placed:  Orders Placed This Encounter  Procedures   EKG 12-Lead   ECHOCARDIOGRAM COMPLETE    Standing Status:   Future    Expiration Date:   05/26/2024    Where should this test be performed:   [facility])    Does the patient weigh less than or greater than 250 lbs?:   Patient weighs less than 250 lbs    Perflutren DEFINITY (image enhancing agent) should be administered unless hypersensitivity or allergy exist:   Administer Perflutren    Reason for exam-Echo:   Other-Full Diagnosis List    Full ICD-10/Reason for Exam:   CAD (coronary artery disease) [829562]    Final Medication List:    Meds ordered this encounter  Medications   Bempedoic Acid-Ezetimibe (NEXLIZET) 180-10 MG TABS    Sig: Take 1 tablet by mouth daily.    Dispense:  90 tablet    Refill:  0    Medications Discontinued During This Encounter  Medication Reason   aspirin EC (ASPIRIN LOW DOSE) 81 MG tablet Discontinued by provider   Bempedoic Acid-Ezetimibe (NEXLIZET) 180-10 MG TABS Reorder     Current Outpatient Medications:    amLODipine (NORVASC) 5 MG tablet, Take 1 tablet (5 mg total) by mouth daily., Disp: 30 tablet, Rfl: 1   atorvastatin (LIPITOR) 80 MG tablet, TAKE 1 TABLET BY MOUTH AT BEDTIME, Disp: 90 tablet, Rfl: 0   benzonatate (TESSALON) 100 MG capsule, Take 2 capsules (200 mg total) by mouth every 8 (eight) hours., Disp: 21 capsule, Rfl: 0   clopidogrel (PLAVIX) 75 MG tablet, Take 1 tablet (75 mg total) by mouth daily., Disp: 90 tablet, Rfl: 3   ipratropium (ATROVENT) 0.06 % nasal spray, Place 2 sprays into both nostrils 4 (four) times daily., Disp: 15 mL, Rfl: 12   metoprolol succinate (TOPROL-XL)  25 MG 24 hr tablet, TAKE 1 TABLET BY MOUTH IN THE MORNING, Disp: 90 tablet, Rfl: 1   omeprazole (PRILOSEC) 20 MG capsule, Take 1 capsule (20 mg total) by mouth daily as needed for up to 30 doses., Disp: 30 capsule, Rfl: 0   promethazine-dextromethorphan (PROMETHAZINE-DM) 6.25-15 MG/5ML syrup, Take 5 mLs by mouth 4 (four) times daily as needed., Disp: 118 mL, Rfl: 0   tamsulosin (FLOMAX) 0.4 MG CAPS capsule, Take 0.4 mg by mouth daily., Disp: , Rfl:    Bempedoic Acid-Ezetimibe (NEXLIZET) 180-10 MG TABS, Take 1 tablet by mouth daily., Disp: 90 tablet, Rfl: 0  Consent:   NA  Disposition:   1 year sooner if needed.  His questions and concerns were addressed to his satisfaction. He voices understanding of the recommendations provided during this encounter.   Signed, Tessa Lerner, DO, [facility]  [facility]  [address] [place], [place] [address] 05/30/2023 10:53 AM

## 2023-05-27 NOTE — Telephone Encounter (Signed)
 Spoke with pt's wife over the phone last week and after discussing this issue with [facility] on the phone, explained to the pt's wife that is seemed the issue was due to a payment needing to be made. Pt's wife verbalized understanding and had no further questions at this time.

## 2023-05-27 NOTE — Patient Instructions (Signed)
 Medication Instructions:  Your physician has recommended you make the following change in your medication:   STOP Aspirin 81 mg  Continue Plavix. Make sure to stop Plavix 7 days before your TURP.   *If you need a refill on your cardiac medications before your next appointment, please call your pharmacy*  Lab Work: None ordered today. If you have labs (blood work) drawn today and your tests are completely normal, you will receive your results only by: MyChart Message (if you have MyChart) OR A paper copy in the mail If you have any lab test that is abnormal or we need to change your treatment, we will call you to review the results.  Testing/Procedures: Your physician has requested that you have an echocardiogram prior to your 1 year follow-up with Dr. Odis Hollingshead. Echocardiography is a painless test that uses sound waves to create images of your heart. It provides your doctor with information about the size and shape of your heart and how well your heart's chambers and valves are working. This procedure takes approximately one hour. There are no restrictions for this procedure. Please do NOT wear cologne, perfume, aftershave, or lotions (deodorant is allowed). Please arrive 15 minutes prior to your appointment time.  Please note: We ask at that you not bring children with you during ultrasound (echo/ vascular) testing. Due to room size and safety concerns, children are not allowed in the ultrasound rooms during exams. Our front office staff cannot provide observation of children in our lobby area while testing is being conducted. An adult accompanying a patient to their appointment will only be allowed in the ultrasound room at the discretion of the ultrasound technician under special circumstances. We apologize for any inconvenience.   Follow-Up: At [facility], you and your health needs are our priority.  As part of our continuing mission to provide you with exceptional heart care, we have  created designated Provider Care Teams.  These Care Teams include your primary Cardiologist (physician) and Advanced Practice Providers (APPs -  Physician Assistants and Nurse Practitioners) who all work together to provide you with the care you need, when you need it.  We recommend signing up for the patient portal called "MyChart".  Sign up information is provided on this After Visit Summary.  MyChart is used to connect with patients for Virtual Visits (Telemedicine).  Patients are able to view lab/test results, encounter notes, upcoming appointments, etc.  Non-urgent messages can be sent to your provider as well.   To learn more about what you can do with MyChart, go to [email or web address].    Your next appointment:   1 year(s)  The format for your next appointment:   In Person  Provider:   Tessa Lerner, DO {

## 2023-05-28 DIAGNOSIS — R7303 Prediabetes: Secondary | ICD-10-CM | POA: Diagnosis not present

## 2023-05-28 DIAGNOSIS — N400 Enlarged prostate without lower urinary tract symptoms: Secondary | ICD-10-CM | POA: Diagnosis not present

## 2023-05-28 DIAGNOSIS — I2510 Atherosclerotic heart disease of native coronary artery without angina pectoris: Secondary | ICD-10-CM | POA: Diagnosis not present

## 2023-05-28 DIAGNOSIS — E782 Mixed hyperlipidemia: Secondary | ICD-10-CM | POA: Diagnosis not present

## 2023-05-28 DIAGNOSIS — I10 Essential (primary) hypertension: Secondary | ICD-10-CM | POA: Diagnosis not present

## 2023-05-30 ENCOUNTER — Encounter: Payer: Self-pay | Admitting: Cardiology

## 2023-05-30 MED ORDER — NEXLIZET 180-10 MG PO TABS: 1.0000 | ORAL_TABLET | Freq: Every day | ORAL | 0 refills | Status: DC

## 2023-05-31 ENCOUNTER — Telehealth: Payer: Self-pay | Admitting: Cardiology

## 2023-05-31 NOTE — Telephone Encounter (Signed)
 Follow Up:      Wife would like for Harlow Ohms to please her a call please.

## 2023-06-01 ENCOUNTER — Other Ambulatory Visit: Payer: Self-pay

## 2023-06-01 DIAGNOSIS — I2510 Atherosclerotic heart disease of native coronary artery without angina pectoris: Secondary | ICD-10-CM

## 2023-06-01 DIAGNOSIS — Z951 Presence of aortocoronary bypass graft: Secondary | ICD-10-CM

## 2023-06-04 ENCOUNTER — Other Ambulatory Visit (HOSPITAL_COMMUNITY): Payer: Self-pay

## 2023-06-04 NOTE — Telephone Encounter (Signed)
 [facility] with the number the pt's wife had given me. [facility] to [facility] so a verbal order has been placed for pt.

## 2023-07-20 ENCOUNTER — Other Ambulatory Visit (HOSPITAL_COMMUNITY): Payer: Self-pay

## 2023-07-20 ENCOUNTER — Telehealth: Payer: Self-pay | Admitting: Pharmacy Technician

## 2023-07-20 NOTE — Telephone Encounter (Signed)
   I FAXED FORM FROM WEBSITE SINCE WON'T LET ME IN CMM. RX DOES REJECT ON BLUE E SAYING PLAN LIMITS. HE HAS ANOTHER INSURANCE BUT THE COPAY IS $279.38 FOR 30 DAYS AND THAT WAS WITH A COUPON.   Faxed [phone]

## 2023-07-21 NOTE — Telephone Encounter (Signed)
 Pharmacy Patient Advocate Encounter  Received notification from [facility] federal  that Prior Authorization for Nexlizet  has been APPROVED from 06/20/23 to 07/19/24

## 2023-07-21 NOTE — Telephone Encounter (Signed)
 I called the patient to let him know about the approval since he gets mail order

## 2023-09-14 DIAGNOSIS — I10 Essential (primary) hypertension: Secondary | ICD-10-CM | POA: Diagnosis not present

## 2023-09-14 DIAGNOSIS — Z125 Encounter for screening for malignant neoplasm of prostate: Secondary | ICD-10-CM | POA: Diagnosis not present

## 2023-09-14 DIAGNOSIS — E782 Mixed hyperlipidemia: Secondary | ICD-10-CM | POA: Diagnosis not present

## 2023-09-14 DIAGNOSIS — N400 Enlarged prostate without lower urinary tract symptoms: Secondary | ICD-10-CM | POA: Diagnosis not present

## 2023-09-14 DIAGNOSIS — Z0000 Encounter for general adult medical examination without abnormal findings: Secondary | ICD-10-CM | POA: Diagnosis not present

## 2023-10-30 ENCOUNTER — Other Ambulatory Visit: Payer: Self-pay | Admitting: Cardiology

## 2023-10-30 DIAGNOSIS — I2510 Atherosclerotic heart disease of native coronary artery without angina pectoris: Secondary | ICD-10-CM

## 2023-10-30 DIAGNOSIS — Z951 Presence of aortocoronary bypass graft: Secondary | ICD-10-CM

## 2023-11-02 ENCOUNTER — Inpatient Hospital Stay

## 2023-11-02 ENCOUNTER — Inpatient Hospital Stay: Attending: Oncology | Admitting: Oncology

## 2023-11-02 ENCOUNTER — Encounter: Payer: Self-pay | Admitting: Oncology

## 2023-11-02 VITALS — BP 146/83 | HR 57 | Temp 97.4°F | Resp 16 | Wt 175.0 lb

## 2023-11-02 DIAGNOSIS — Z79899 Other long term (current) drug therapy: Secondary | ICD-10-CM | POA: Diagnosis not present

## 2023-11-02 DIAGNOSIS — Z823 Family history of stroke: Secondary | ICD-10-CM | POA: Insufficient documentation

## 2023-11-02 DIAGNOSIS — N400 Enlarged prostate without lower urinary tract symptoms: Secondary | ICD-10-CM | POA: Insufficient documentation

## 2023-11-02 DIAGNOSIS — Z8249 Family history of ischemic heart disease and other diseases of the circulatory system: Secondary | ICD-10-CM | POA: Diagnosis not present

## 2023-11-02 DIAGNOSIS — D708 Other neutropenia: Secondary | ICD-10-CM

## 2023-11-02 DIAGNOSIS — Z951 Presence of aortocoronary bypass graft: Secondary | ICD-10-CM | POA: Insufficient documentation

## 2023-11-02 DIAGNOSIS — I2510 Atherosclerotic heart disease of native coronary artery without angina pectoris: Secondary | ICD-10-CM | POA: Diagnosis not present

## 2023-11-02 DIAGNOSIS — I10 Essential (primary) hypertension: Secondary | ICD-10-CM | POA: Insufficient documentation

## 2023-11-02 DIAGNOSIS — E785 Hyperlipidemia, unspecified: Secondary | ICD-10-CM | POA: Diagnosis not present

## 2023-11-02 DIAGNOSIS — D709 Neutropenia, unspecified: Secondary | ICD-10-CM | POA: Diagnosis not present

## 2023-11-02 DIAGNOSIS — Z7902 Long term (current) use of antithrombotics/antiplatelets: Secondary | ICD-10-CM | POA: Diagnosis not present

## 2023-11-02 DIAGNOSIS — Z7982 Long term (current) use of aspirin: Secondary | ICD-10-CM | POA: Diagnosis not present

## 2023-11-02 LAB — CBC WITH DIFFERENTIAL/PLATELET
Abs Immature Granulocytes: 0.01 K/uL (ref 0.00–0.07)
Basophils Absolute: 0 K/uL (ref 0.0–0.1)
Basophils Relative: 1 %
Eosinophils Absolute: 0.2 K/uL (ref 0.0–0.5)
Eosinophils Relative: 7 %
HCT: 46.8 % (ref 39.0–52.0)
Hemoglobin: 16.1 g/dL (ref 13.0–17.0)
Immature Granulocytes: 0 %
Lymphocytes Relative: 40 %
Lymphs Abs: 1.3 K/uL (ref 0.7–4.0)
MCH: 28.8 pg (ref 26.0–34.0)
MCHC: 34.4 g/dL (ref 30.0–36.0)
MCV: 83.6 fL (ref 80.0–100.0)
Monocytes Absolute: 0.4 K/uL (ref 0.1–1.0)
Monocytes Relative: 11 %
Neutro Abs: 1.3 K/uL — ABNORMAL LOW (ref 1.7–7.7)
Neutrophils Relative %: 41 %
Platelets: 176 K/uL (ref 150–400)
RBC: 5.6 MIL/uL (ref 4.22–5.81)
RDW: 12.3 % (ref 11.5–15.5)
WBC: 3.3 K/uL — ABNORMAL LOW (ref 4.0–10.5)
nRBC: 0 % (ref 0.0–0.2)

## 2023-11-02 LAB — FOLATE: Folate: 17.5 ng/mL (ref >5.9)

## 2023-11-02 LAB — VITAMIN B12: Vitamin B-12: 691 pg/mL (ref 180–914)

## 2023-11-02 LAB — HIV ANTIBODY (ROUTINE TESTING W REFLEX): HIV Screen 4th Generation wRfx: NONREACTIVE

## 2023-11-02 LAB — HEPATITIS C ANTIBODY: HCV Ab: NONREACTIVE

## 2023-11-02 NOTE — Progress Notes (Signed)
 Hematology/Oncology Consult note [facility] Telephone:([phone] [phone]  Patient Care Team: Rolinda Millman, MD as PCP - General (Family Medicine) Michele Richardson, DO as PCP - Cardiology (Cardiology)   Name of the patient: Brian Curry  [id number]  [birth year]    Reason for referral-neutropenia   Referring physician-Dr. Rolinda  Date of visit: 11/02/23   History of presenting illness-patient is a 63 year old African-American male with a past medical history of CAD, CABG currently on aspirin  and Plavix .  He has been referred for Neutropenia.  As per labs from 10/04/2023 patient noted to have a white count of 3.1, H&H of 15.4/44.8 and a platelet count of 170.  ANC was 1.5.  Looking back at his prior CBCs patient's white cell count mainly fluctuates between 3.7-4.8.  Patient denies any symptoms of recurrent infections or hospitalizations.  He has had about 2-3 episodes of cold this year.  Appetite and weight have remained stable.  Denies any known history of autoimmune disorders.  Denies any over-the-counter herbal medications.  ECOG PS- 0  Pain scale- 0   Review of systems- Review of Systems  Constitutional:  Negative for chills, fever, malaise/fatigue and weight loss.  HENT:  Negative for congestion, ear discharge and nosebleeds.   Eyes:  Negative for blurred vision.  Respiratory:  Negative for cough, hemoptysis, sputum production, shortness of breath and wheezing.   Cardiovascular:  Negative for chest pain, palpitations, orthopnea and claudication.  Gastrointestinal:  Negative for abdominal pain, blood in stool, constipation, diarrhea, heartburn, melena, nausea and vomiting.  Genitourinary:  Negative for dysuria, flank pain, frequency, hematuria and urgency.  Musculoskeletal:  Negative for back pain, joint pain and myalgias.  Skin:  Negative for rash.  Neurological:  Negative for dizziness, tingling, focal weakness, seizures, weakness and  headaches.  Endo/Heme/Allergies:  Does not bruise/bleed easily.  Psychiatric/Behavioral:  Negative for depression and suicidal ideas. The patient does not have insomnia.     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   CAD (coronary artery disease) 07/01/2020   Anginal equivalent (HCC)    Precordial pain      Past Medical History:  Diagnosis Date   BPH (benign prostatic hyperplasia)    Coronary artery disease    GERD (gastroesophageal reflux disease)    Hyperlipidemia    Hypertension      Past Surgical History:  Procedure Laterality Date   COLONOSCOPY     CORONARY ARTERY BYPASS GRAFT N/A 07/09/2020   Procedure: CORONARY ARTERY BYPASS GRAFTING (CABG) X TWO USING LEFT INTERNAL MAMMARY ARTERY AND LEFT RADIAL ARTERY;  Surgeon: Shyrl Linnie KIDD, MD;  Location: [facility] OR;  Service: Open Heart Surgery;  Laterality: N/A;   HERNIA REPAIR     LEFT HEART CATH AND CORONARY ANGIOGRAPHY N/A 07/02/2020   Procedure: LEFT HEART CATH AND CORONARY ANGIOGRAPHY;  Surgeon: Elmira Newman PARAS, MD;  Location: [facility] INVASIVE CV LAB;  Service: Cardiovascular;  Laterality: N/A;   RADIAL ARTERY HARVEST Left 07/09/2020   Procedure: RADIAL ARTERY HARVEST;  Surgeon: Shyrl Linnie KIDD, MD;  Location: [facility] OR;  Service: Open Heart Surgery;  Laterality: Left;   TEE WITHOUT CARDIOVERSION N/A 07/09/2020   Procedure: TRANSESOPHAGEAL ECHOCARDIOGRAM (TEE);  Surgeon: Shyrl Linnie KIDD, MD;  Location: [facility];  Service: Open Heart Surgery;  Laterality: N/A;   WISDOM TOOTH EXTRACTION      Social History   Socioeconomic History   Marital status: Married  Spouse name: Not on file   Number of children: 1   Years of education: Not on file   Highest education level: Not on file  Occupational History   Not on file  Tobacco Use   Smoking status: Never   Smokeless tobacco: Never  Vaping Use   Vaping status: Never Used  Substance and Sexual Activity   Alcohol  use: Yes    Comment: occasional   Drug use: Never   Sexual activity: Not on file  Other Topics Concern   Not on file  Social History Narrative   Not on file   Social Drivers of Health   Financial Resource Strain: Not on file  Food Insecurity: No Food Insecurity (11/02/2023)   Hunger Vital Sign    Worried About Running Out of Food in the Last Year: Never true    Ran Out of Food in the Last Year: Never true  Transportation Needs: No Transportation Needs (11/02/2023)   PRAPARE - Administrator, Civil Service (Medical): No    Lack of Transportation (Non-Medical): No  Physical Activity: Not on file  Stress: Not on file  Social Connections: Not on file  Intimate Partner Violence: Not At Risk (11/02/2023)   Humiliation, Afraid, Rape, and Kick questionnaire    Fear of Current or Ex-Partner: No    Emotionally Abused: No    Physically Abused: No    Sexually Abused: No     Family History  Problem Relation Age of Onset   Heart attack Mother    Hypertension Mother    Hypertension Father    Heart attack Father    Stroke Father      Current Outpatient Medications:    amLODipine  (NORVASC ) 5 MG tablet, Take 1 tablet (5 mg total) by mouth daily., Disp: 30 tablet, Rfl: 1   atorvastatin  (LIPITOR ) 80 MG tablet, TAKE 1 TABLET BY MOUTH AT BEDTIME, Disp: 90 tablet, Rfl: 0   Bempedoic Acid-Ezetimibe (NEXLIZET ) 180-10 MG TABS, Take 1 tablet by mouth daily., Disp: 90 tablet, Rfl: 0   benzonatate  (TESSALON ) 100 MG capsule, Take 2 capsules (200 mg total) by mouth every 8 (eight) hours., Disp: 21 capsule, Rfl: 0   clopidogrel  (PLAVIX ) 75 MG tablet, Take 1 tablet by mouth once daily, Disp: 90 tablet, Rfl: 1   ipratropium (ATROVENT ) 0.06 % nasal spray, Place 2 sprays into both nostrils 4 (four) times daily., Disp: 15 mL, Rfl: 12   metoprolol  succinate (TOPROL -XL) 25 MG 24 hr tablet, TAKE 1 TABLET BY MOUTH IN THE MORNING, Disp: 90 tablet, Rfl: 1   promethazine -dextromethorphan (PROMETHAZINE -DM)  6.25-15 MG/5ML syrup, Take 5 mLs by mouth 4 (four) times daily as needed., Disp: 118 mL, Rfl: 0   omeprazole  (PRILOSEC) 20 MG capsule, Take 1 capsule (20 mg total) by mouth daily as needed for up to 30 doses. (Patient not taking: Reported on 11/02/2023), Disp: 30 capsule, Rfl: 0   tamsulosin (FLOMAX) 0.4 MG CAPS capsule, Take 0.4 mg by mouth daily. (Patient not taking: Reported on 11/02/2023), Disp: , Rfl:    Physical exam:  Vitals:   11/02/23 1047  BP: (!) 146/83  Pulse: (!) 57  Resp: 16  Temp: (!) 97.4 F (36.3 C)  TempSrc: Tympanic  SpO2: 99%  Weight: 175 lb (79.4 kg)   Physical Exam Cardiovascular:     Rate and Rhythm: Normal rate and regular rhythm.     Heart sounds: Normal heart sounds.  Pulmonary:     Effort: Pulmonary effort is normal.  Breath sounds: Normal breath sounds.  Abdominal:     General: Bowel sounds are normal.     Palpations: Abdomen is soft.     Comments: No palpable hepatosplenomegaly  Lymphadenopathy:     Comments: No palpable cervical, supraclavicular, axillary or inguinal adenopathy    Skin:    General: Skin is warm and dry.  Neurological:     Mental Status: He is alert and oriented to person, place, and time.           Latest Ref Rng & Units 09/28/2022   11:22 AM  CMP  Glucose 70 - 99 mg/dL 897   BUN 8 - 27 mg/dL 17   Creatinine 9.23 - 1.27 mg/dL 8.70   Sodium 865 - 855 mmol/L 139   Potassium 3.5 - 5.2 mmol/L 4.6   Chloride 96 - 106 mmol/L 102   CO2 20 - 29 mmol/L 24   Calcium  8.6 - 10.2 mg/dL 9.0   Total Protein 6.0 - 8.5 g/dL 7.0   Total Bilirubin 0.0 - 1.2 mg/dL 0.7   Alkaline Phos 44 - 121 IU/L 78   AST 0 - 40 IU/L 26   ALT 0 - 44 IU/L 20       Latest Ref Rng & Units 11/02/2023   11:56 AM  CBC  WBC 4.0 - 10.5 K/uL 3.3   Hemoglobin 13.0 - 17.0 g/dL 83.8   Hematocrit 60.9 - 52.0 % 46.8   Platelets 150 - 400 K/uL 176      Assessment and plan- Patient is a 63 y.o. male referred for neutropenia  Patient has mild isolated  neutropenia based on his labs in July 2025 and his ANC was 1.5 in the absence of other cytopenias.  No over-the-counter medications.  No palpable adenopathy or splenomegaly on today's exam.  I am checking CBC with differential B12 folate HIV and hepatitis C testing.  I am inclined to monitor his neutropenia conservatively without the need for bone marrow biopsy at this time.  If there is a continued downward trend in his neutrophil count and it goes down to less than 1 I will consider getting a bone marrow biopsy at that time.  I will tentatively see him back in 2 to 3 weeks time to discuss the results of blood work   Thank you for this kind referral and the opportunity to participate in the care of this patient   Visit Diagnosis 1. Other neutropenia (HCC)     Dr. Annah Skene, MD, MPH [facility] at [facility] [phone] 11/02/2023

## 2023-11-08 ENCOUNTER — Ambulatory Visit (HOSPITAL_COMMUNITY)
Admission: RE | Admit: 2023-11-08 | Discharge: 2023-11-08 | Disposition: A | Discharge status: Home / Self Care | Payer: Federal, State, Local not specified - PPO | Source: Ambulatory Visit | Attending: Cardiology | Admitting: Cardiology

## 2023-11-08 DIAGNOSIS — I2510 Atherosclerotic heart disease of native coronary artery without angina pectoris: Secondary | ICD-10-CM | POA: Insufficient documentation

## 2023-11-08 LAB — ECHOCARDIOGRAM COMPLETE
Area-P 1/2: 4.19 cm2
Est EF: 55
S' Lateral: 2.83 cm

## 2023-11-11 ENCOUNTER — Ambulatory Visit: Payer: Self-pay | Admitting: Cardiology

## 2023-11-25 ENCOUNTER — Encounter: Payer: Self-pay | Admitting: Oncology

## 2023-11-25 ENCOUNTER — Inpatient Hospital Stay (HOSPITAL_BASED_OUTPATIENT_CLINIC_OR_DEPARTMENT_OTHER): Admitting: Oncology

## 2023-11-25 DIAGNOSIS — D708 Other neutropenia: Secondary | ICD-10-CM

## 2023-11-25 NOTE — Progress Notes (Signed)
 Patient states he's doing okay overall, but currently battling some joint pain and would like insight regarding what can be done about that.

## 2023-11-25 NOTE — Progress Notes (Signed)
 I connected with Brian Curry on 11/25/23 at 10:00 AM EDT by video enabled telemedicine visit and verified that I am speaking with the correct person using two identifiers.   I discussed the limitations, risks, security and privacy concerns of performing an evaluation and management service by telemedicine and the availability of in-person appointments. I also discussed with the patient that there may be a patient responsible charge related to this service. The patient expressed understanding and agreed to proceed.  Other persons participating in the visit and their role in the encounter:  none  Patient's location:  home Provider's location:  home  Chief Complaint:  discuss results of bloodwork  History of present illness: patient is a 63 year old African-American male with a past medical history of CAD, CABG currently on aspirin  and Plavix .  He has been referred for Neutropenia.  As per labs from 10/04/2023 patient noted to have a white count of 3.1, H&H of 15.4/44.8 and a platelet count of 170.  ANC was 1.5.  Looking back at his prior CBCs patient's white cell count mainly fluctuates between 3.7-4.8.  Patient denies any symptoms of recurrent infections or hospitalizations.  He has had about 2-3 episodes of cold this year.  Appetite and weight have remained stable.  Denies any known history of autoimmune disorders.  Denies any over-the-counter herbal medications.   Results of blood work from 11/02/2023 were as follows: CBC showed white count of 3.3 with an H&H of 16.1/46.8 and a platelet count of 176.  ANC was mildly Low at 1.3.  B12 levels normal at 691.  Folate normal.  HIV and hepatitis C testing negative.  Interval history: he has had 3-4 episodes of cold this year. He has been having persistent pain in his right elbow which he thought was from workign out but ha not improved for over 6 months now   Review of Systems  Constitutional:  Negative for chills, fever, malaise/fatigue and weight loss.   HENT:  Negative for congestion, ear discharge and nosebleeds.   Eyes:  Negative for blurred vision.  Respiratory:  Negative for cough, hemoptysis, sputum production, shortness of breath and wheezing.   Cardiovascular:  Negative for chest pain, palpitations, orthopnea and claudication.  Gastrointestinal:  Negative for abdominal pain, blood in stool, constipation, diarrhea, heartburn, melena, nausea and vomiting.  Genitourinary:  Negative for dysuria, flank pain, frequency, hematuria and urgency.  Musculoskeletal:  Positive for joint pain. Negative for back pain and myalgias.  Skin:  Negative for rash.  Neurological:  Negative for dizziness, tingling, focal weakness, seizures, weakness and headaches.  Endo/Heme/Allergies:  Does not bruise/bleed easily.  Psychiatric/Behavioral:  Negative for depression and suicidal ideas. The patient does not have insomnia.     No Known Allergies  Past Medical History:  Diagnosis Date   BPH (benign prostatic hyperplasia)    Coronary artery disease    GERD (gastroesophageal reflux disease)    Hyperlipidemia    Hypertension     Past Surgical History:  Procedure Laterality Date   COLONOSCOPY     CORONARY ARTERY BYPASS GRAFT N/A 07/09/2020   Procedure: CORONARY ARTERY BYPASS GRAFTING (CABG) X TWO USING LEFT INTERNAL MAMMARY ARTERY AND LEFT RADIAL ARTERY;  Surgeon: Shyrl Linnie KIDD, MD;  Location: [facility] OR;  Service: Open Heart Surgery;  Laterality: N/A;   HERNIA REPAIR     LEFT HEART CATH AND CORONARY ANGIOGRAPHY N/A 07/02/2020   Procedure: LEFT HEART CATH AND CORONARY ANGIOGRAPHY;  Surgeon: Elmira Newman PARAS, MD;  Location: [facility] INVASIVE [facility];  Service: Cardiovascular;  Laterality: N/A;   RADIAL ARTERY HARVEST Left 07/09/2020   Procedure: RADIAL ARTERY HARVEST;  Surgeon: Shyrl Linnie KIDD, MD;  Location: [facility] OR;  Service: Open Heart Surgery;  Laterality: Left;   TEE WITHOUT CARDIOVERSION N/A 07/09/2020   Procedure: TRANSESOPHAGEAL ECHOCARDIOGRAM  (TEE);  Surgeon: Shyrl Linnie KIDD, MD;  Location: [facility] OR;  Service: Open Heart Surgery;  Laterality: N/A;   WISDOM TOOTH EXTRACTION      Social History   Socioeconomic History   Marital status: Married    Spouse name: Not on file   Number of children: 1   Years of education: Not on file   Highest education level: Not on file  Occupational History   Not on file  Tobacco Use   Smoking status: Never   Smokeless tobacco: Never  Vaping Use   Vaping status: Never Used  Substance and Sexual Activity   Alcohol use: Yes    Comment: occasional   Drug use: Never   Sexual activity: Not on file  Other Topics Concern   Not on file  Social History Narrative   Not on file   Social Drivers of Health   Financial Resource Strain: Not on file  Food Insecurity: No Food Insecurity (11/02/2023)   Hunger Vital Sign    Worried About Running Out of Food in the Last Year: Never true    Ran Out of Food in the Last Year: Never true  Transportation Needs: No Transportation Needs (11/02/2023)   PRAPARE - Administrator, Civil Service (Medical): No    Lack of Transportation (Non-Medical): No  Physical Activity: Not on file  Stress: Not on file  Social Connections: Not on file  Intimate Partner Violence: Not At Risk (11/02/2023)   Humiliation, Afraid, Rape, and Kick questionnaire    Fear of Current or Ex-Partner: No    Emotionally Abused: No    Physically Abused: No    Sexually Abused: No    Family History  Problem Relation Age of Onset   Heart attack Mother    Hypertension Mother    Hypertension Father    Heart attack Father    Stroke Father      Current Outpatient Medications:    amLODipine  (NORVASC ) 5 MG tablet, Take 1 tablet (5 mg total) by mouth daily., Disp: 30 tablet, Rfl: 1   atorvastatin  (LIPITOR ) 80 MG tablet, TAKE 1 TABLET BY MOUTH AT BEDTIME, Disp: 90 tablet, Rfl: 0   Bempedoic Acid-Ezetimibe (NEXLIZET ) 180-10 MG TABS, Take 1 tablet by mouth daily., Disp: 90 tablet,  Rfl: 0   benzonatate  (TESSALON ) 100 MG capsule, Take 2 capsules (200 mg total) by mouth every 8 (eight) hours., Disp: 21 capsule, Rfl: 0   clopidogrel  (PLAVIX ) 75 MG tablet, Take 1 tablet by mouth once daily, Disp: 90 tablet, Rfl: 1   ipratropium (ATROVENT ) 0.06 % nasal spray, Place 2 sprays into both nostrils 4 (four) times daily., Disp: 15 mL, Rfl: 12   metoprolol  succinate (TOPROL -XL) 25 MG 24 hr tablet, TAKE 1 TABLET BY MOUTH IN THE MORNING, Disp: 90 tablet, Rfl: 1   omeprazole  (PRILOSEC) 20 MG capsule, Take 1 capsule (20 mg total) by mouth daily as needed for up to 30 doses. (Patient not taking: Reported on 11/02/2023), Disp: 30 capsule, Rfl: 0   promethazine -dextromethorphan (PROMETHAZINE -DM) 6.25-15 MG/5ML syrup, Take 5 mLs by mouth 4 (four) times daily as needed. (Patient not taking: Reported on 11/25/2023), Disp: 118 mL, Rfl: 0   tamsulosin (FLOMAX)  0.4 MG CAPS capsule, Take 0.4 mg by mouth daily. (Patient not taking: Reported on 11/02/2023), Disp: , Rfl:   ECHOCARDIOGRAM COMPLETE Result Date: 11/08/2023    ECHOCARDIOGRAM REPORT   Patient Name:   JAMARE VANATTA Date of Exam: 11/08/2023 Medical Rec #:  [id number]            Height:       68.0 in Accession #:    [id number]           Weight:       175.0 lb Date of Birth:  1961/02/04            BSA:          1.931 m Patient Age:    63 years             BP:           146/83 mmHg Patient Gender: M                    HR:           52 bpm. Exam Location:  [address] Procedure: 2D Echo, 3D Echo and Strain Analysis (Both Spectral and Color Flow            Doppler were utilized during procedure). Indications:    I25.10 Coronary artery disease  History:        Patient has prior history of Echocardiogram examinations, most                 recent 07/09/2020. Prior CABG; Risk Factors:Hypertension,                 Dyslipidemia and Family History of Coronary Artery Disease.  Sonographer:    Jon Hacker RCS Referring Phys: [id number] MADONNA LARGE IMPRESSIONS  1.  Left ventricular ejection fraction, by estimation, is 55%. Left ventricular ejection fraction by 3D volume is 54 %. The left ventricle has normal function. The left ventricle has no regional wall motion abnormalities but there was septal bounce suggestive of prior cardiac surgery. Left ventricular diastolic parameters are consistent with Grade I diastolic dysfunction (impaired relaxation). The average left ventricular global longitudinal strain is -23.9 %. The global longitudinal strain is normal.  2. Right ventricular systolic function is mildly reduced. The right ventricular size is normal. Tricuspid regurgitation signal is inadequate for assessing PA pressure.  3. The mitral valve is normal in structure. Trivial mitral valve regurgitation. No evidence of mitral stenosis.  4. The aortic valve is tricuspid. There is moderate calcification of the aortic valve. Aortic valve regurgitation is not visualized. Aortic valve sclerosis/calcification is present, without any evidence of aortic stenosis.  5. The inferior vena cava is normal in size with greater than 50% respiratory variability, suggesting right atrial pressure of 3 mmHg. FINDINGS  Left Ventricle: Left ventricular ejection fraction, by estimation, is 55%. Left ventricular ejection fraction by 3D volume is 54 %. The left ventricle has normal function. The left ventricle has no regional wall motion abnormalities. The average left ventricular global longitudinal strain is -23.9 %. Strain was performed and the global longitudinal strain is normal. The left ventricular internal cavity size was normal in size. There is no left ventricular hypertrophy. Left ventricular diastolic parameters are consistent with Grade I diastolic dysfunction (impaired relaxation). Right Ventricle: The right ventricular size is normal. No increase in right ventricular wall thickness. Right ventricular systolic function is mildly reduced. Tricuspid regurgitation signal is inadequate for  assessing PA pressure. Left  Atrium: Left atrial size was normal in size. Right Atrium: Right atrial size was normal in size. Pericardium: There is no evidence of pericardial effusion. Mitral Valve: The mitral valve is normal in structure. There is mild calcification of the mitral valve leaflet(s). Mild mitral annular calcification. Trivial mitral valve regurgitation. No evidence of mitral valve stenosis. Tricuspid Valve: The tricuspid valve is normal in structure. Tricuspid valve regurgitation is trivial. Aortic Valve: The aortic valve is tricuspid. There is moderate calcification of the aortic valve. Aortic valve regurgitation is not visualized. Aortic valve sclerosis/calcification is present, without any evidence of aortic stenosis. Pulmonic Valve: The pulmonic valve was normal in structure. Pulmonic valve regurgitation is not visualized. Aorta: The aortic root is normal in size and structure. Venous: The inferior vena cava is normal in size with greater than 50% respiratory variability, suggesting right atrial pressure of 3 mmHg. IAS/Shunts: No atrial level shunt detected by color flow Doppler. Additional Comments: 3D was performed not requiring image post processing on an independent workstation and was normal.  LEFT VENTRICLE PLAX 2D LVIDd:         4.23 cm         Diastology LVIDs:         2.83 cm         LV e' medial:    4.30 cm/s LV PW:         0.81 cm         LV E/e' medial:  18.8 LV IVS:        0.82 cm         LV e' lateral:   9.97 cm/s LVOT diam:     2.00 cm         LV E/e' lateral: 8.1 LV SV:         71 LV SV Index:   37              2D Longitudinal LVOT Area:     3.14 cm        Strain                                2D Strain GLS   -21.0 %                                (A4C):                                2D Strain GLS   -22.5 %                                (A3C):                                2D Strain GLS   -28.1 %                                (A2C):                                2D Strain GLS    -23.9 %  Avg:                                 3D Volume EF                                LV 3D EF:    Left                                             ventricul                                             ar                                             ejection                                             fraction                                             by 3D                                             volume is                                             54 %.                                 3D Volume EF:                                3D EF:        54 %                                LV EDV:       125 ml                                LV ESV:       57 ml                                LV SV:        68 ml RIGHT VENTRICLE RV Basal diam:  3.79  cm RV S prime:     7.62 cm/s TAPSE (M-mode): 1.0 cm LEFT ATRIUM             Index        RIGHT ATRIUM           Index LA diam:        3.60 cm 1.86 cm/m   RA Area:     15.80 cm LA Vol (A2C):   40.2 ml 20.82 ml/m  RA Volume:   40.40 ml  20.92 ml/m LA Vol (A4C):   42.0 ml 21.75 ml/m LA Biplane Vol: 40.8 ml 21.13 ml/m  AORTIC VALVE LVOT Vmax:   96.60 cm/s LVOT Vmean:  62.000 cm/s LVOT VTI:    0.226 m  AORTA Ao Root diam: 3.10 cm Ao Asc diam:  3.60 cm MITRAL VALVE MV Area (PHT): 4.19 cm    SHUNTS MV Decel Time: 181 msec    Systemic VTI:  0.23 m MV E velocity: 80.70 cm/s  Systemic Diam: 2.00 cm MV A velocity: 77.00 cm/s MV E/A ratio:  1.05 Dalton McleanMD Electronically signed by Ezra Kanner Signature Date/Time: 11/08/2023/11:24:52 AM    Final     No images are attached to the encounter.      Latest Ref Rng & Units 09/28/2022   11:22 AM  CMP  Glucose 70 - 99 mg/dL 897   BUN 8 - 27 mg/dL 17   Creatinine 9.23 - 1.27 mg/dL 8.70   Sodium 865 - 855 mmol/L 139   Potassium 3.5 - 5.2 mmol/L 4.6   Chloride 96 - 106 mmol/L 102   CO2 20 - 29 mmol/L 24   Calcium  8.6 - 10.2 mg/dL 9.0   Total Protein 6.0 - 8.5 g/dL 7.0   Total Bilirubin  0.0 - 1.2 mg/dL 0.7   Alkaline Phos 44 - 121 IU/L 78   AST 0 - 40 IU/L 26   ALT 0 - 44 IU/L 20       Latest Ref Rng & Units 11/02/2023   11:56 AM  CBC  WBC 4.0 - 10.5 K/uL 3.3   Hemoglobin 13.0 - 17.0 g/dL 83.8   Hematocrit 60.9 - 52.0 % 46.8   Platelets 150 - 400 K/uL 176      Observation/objective: Appears in no acute distress today.  Breathing is nonlabored  Assessment and plan: Patient is a 63 year old male referred for neutropenia  Patient has mild isolated leukopenia with a white count of 3.3 and an ANC of 1.3.  Looking back at his prior CBCs patient has occasionally had a white blood cell count mostly in the fours and his neutrophil counts have been more than 2.  Testing for B12 folate HIV and hepatitis C was unremarkable.  Patient does not have any B symptoms.  No known autoimmune conditions.  I will be repeating his CBC with differential in the first week of October along with ANA comprehensive panel as well as T-cell gene rearrangement testing to rule out LGL leukemia.  I will see him back 2 weeks after labs.  If there is a persistent downward trend in his neutrophil count at that time I will proceed with bone marrow biopsy.  Follow-up instructions: As above  I discussed the assessment and treatment plan with the patient. The patient was provided an opportunity to ask questions and all were answered. The patient agreed with the plan and demonstrated an understanding of the instructions.   The patient was advised to call back or seek an  in-person evaluation if the symptoms worsen or if the condition fails to improve as anticipated.  I provided 12 minutes of face-to-face video visit time during this encounter, and > 50% was spent counseling as documented under my assessment & plan.  Visit Diagnosis: 1. Other neutropenia (HCC)     Dr. Annah Skene, MD, MPH [facility] Tel- [phone] 11/25/2023 10:16 AM

## 2024-01-03 ENCOUNTER — Inpatient Hospital Stay: Attending: Oncology

## 2024-01-03 DIAGNOSIS — D708 Other neutropenia: Secondary | ICD-10-CM | POA: Insufficient documentation

## 2024-01-17 ENCOUNTER — Inpatient Hospital Stay: Admitting: Oncology

## 2024-01-17 ENCOUNTER — Encounter: Payer: Self-pay | Admitting: Oncology

## 2024-01-17 ENCOUNTER — Inpatient Hospital Stay

## 2024-01-17 VITALS — BP 118/89 | HR 72 | Temp 97.2°F | Resp 19 | Ht 68.0 in | Wt 177.9 lb

## 2024-01-17 DIAGNOSIS — D708 Other neutropenia: Secondary | ICD-10-CM | POA: Diagnosis present

## 2024-01-17 LAB — CBC WITH DIFFERENTIAL (CANCER CENTER ONLY)
Abs Immature Granulocytes: 0.01 K/uL (ref 0.00–0.07)
Basophils Absolute: 0 K/uL (ref 0.0–0.1)
Basophils Relative: 1 %
Eosinophils Absolute: 0.2 K/uL (ref 0.0–0.5)
Eosinophils Relative: 5 %
HCT: 44 % (ref 39.0–52.0)
Hemoglobin: 15.4 g/dL (ref 13.0–17.0)
Immature Granulocytes: 0 %
Lymphocytes Relative: 36 %
Lymphs Abs: 1.4 K/uL (ref 0.7–4.0)
MCH: 28.7 pg (ref 26.0–34.0)
MCHC: 35 g/dL (ref 30.0–36.0)
MCV: 81.9 fL (ref 80.0–100.0)
Monocytes Absolute: 0.5 K/uL (ref 0.1–1.0)
Monocytes Relative: 12 %
Neutro Abs: 1.8 K/uL (ref 1.7–7.7)
Neutrophils Relative %: 46 %
Platelet Count: 191 K/uL (ref 150–400)
RBC: 5.37 MIL/uL (ref 4.22–5.81)
RDW: 12 % (ref 11.5–15.5)
WBC Count: 3.9 K/uL — ABNORMAL LOW (ref 4.0–10.5)
nRBC: 0 % (ref 0.0–0.2)

## 2024-01-17 NOTE — Progress Notes (Signed)
 Hematology/Oncology Consult note [facility]  [phone] [phone]  Patient Care Team: Rolinda Millman, MD as PCP - General (Family Medicine) Michele Richardson, DO as PCP - Cardiology (Cardiology)   Name of the patient: Brian Curry  [id number]  October 05, 1960   Date of visit: 01/17/24  Diagnosis-neutropenia of unclear etiology  Chief complaint/ Reason for visit-routine follow-up of neutropenia  Heme/Onc history: patient is a 63 year old African-American male with a past medical history of CAD, CABG currently on aspirin  and Plavix .  He has been referred for Neutropenia.  As per labs from 10/04/2023 patient noted to have a white count of 3.1, H&H of 15.4/44.8 and a platelet count of 170.  ANC was 1.5.  Looking back at his prior CBCs patient's white cell count mainly fluctuates between 3.7-4.8.  Patient denies any symptoms of recurrent infections or hospitalizations.  He has had about 2-3 episodes of cold this year.  Appetite and weight have remained stable.  Denies any known history of autoimmune disorders.  Denies any over-the-counter herbal medications.   Interval history- Brian Curry is a 63 year old male who presents for follow-up of low white blood cell count.  He was initially evaluated in the first week of August for a low white blood cell count, with a subsequent virtual visit revealing a neutrophil count of 1.3. Tests for B12, folic acid , HIV, and hepatitis were negative.  He was scheduled for additional tests in early October, but these were not completed.  He feels well with no current infections.  ECOG PS- 0 Pain scale- 0  Review of systems- Review of Systems  Constitutional:  Negative for chills, fever, malaise/fatigue and weight loss.  HENT:  Negative for congestion, ear discharge and nosebleeds.   Eyes:  Negative for blurred vision.  Respiratory:  Negative for cough, hemoptysis, sputum production, shortness of breath and  wheezing.   Cardiovascular:  Negative for chest pain, palpitations, orthopnea and claudication.  Gastrointestinal:  Negative for abdominal pain, blood in stool, constipation, diarrhea, heartburn, melena, nausea and vomiting.  Genitourinary:  Negative for dysuria, flank pain, frequency, hematuria and urgency.  Musculoskeletal:  Negative for back pain, joint pain and myalgias.  Skin:  Negative for rash.  Neurological:  Negative for dizziness, tingling, focal weakness, seizures, weakness and headaches.  Endo/Heme/Allergies:  Does not bruise/bleed easily.  Psychiatric/Behavioral:  Negative for depression and suicidal ideas. The patient does not have insomnia.       No Known Allergies   Past Medical History:  Diagnosis Date   BPH (benign prostatic hyperplasia)    Coronary artery disease    GERD (gastroesophageal reflux disease)    Hyperlipidemia    Hypertension      Past Surgical History:  Procedure Laterality Date   COLONOSCOPY     CORONARY ARTERY BYPASS GRAFT N/A 07/09/2020   Procedure: CORONARY ARTERY BYPASS GRAFTING (CABG) X TWO USING LEFT INTERNAL MAMMARY ARTERY AND LEFT RADIAL ARTERY;  Surgeon: Shyrl Linnie KIDD, MD;  Location: [facility] OR;  Service: Open Heart Surgery;  Laterality: N/A;   HERNIA REPAIR     LEFT HEART CATH AND CORONARY ANGIOGRAPHY N/A 07/02/2020   Procedure: LEFT HEART CATH AND CORONARY ANGIOGRAPHY;  Surgeon: Elmira Newman PARAS, MD;  Location: [facility] INVASIVE CV LAB;  Service: Cardiovascular;  Laterality: N/A;   RADIAL ARTERY HARVEST Left 07/09/2020   Procedure: RADIAL ARTERY HARVEST;  Surgeon: Shyrl Linnie KIDD, MD;  Location: [facility] OR;  Service: Open Heart Surgery;  Laterality: Left;  TEE WITHOUT CARDIOVERSION N/A 07/09/2020   Procedure: TRANSESOPHAGEAL ECHOCARDIOGRAM (TEE);  Surgeon: Shyrl Linnie KIDD, MD;  Location[facility] OR;  Service: Open Heart Surgery;  Laterality: N/A;   WISDOM TOOTH EXTRACTION      Social History   Socioeconomic History   Marital status:  Married    Spouse name: Not on file   Number of children: 1   Years of education: Not on file   Highest education level: Not on file  Occupational History   Not on file  Tobacco Use   Smoking status: Never   Smokeless tobacco: Never  Vaping Use   Vaping status: Never Used  Substance and Sexual Activity   Alcohol use: Yes    Comment: occasional   Drug use: Never   Sexual activity: Not on file  Other Topics Concern   Not on file  Social History Narrative   Not on file   Social Drivers of Health   Financial Resource Strain: Not on file  Food Insecurity: No Food Insecurity (11/02/2023)   Hunger Vital Sign    Worried About Running Out of Food in the Last Year: Never true    Ran Out of Food in the Last Year: Never true  Transportation Needs: No Transportation Needs (11/02/2023)   PRAPARE - Administrator, Civil Service (Medical): No    Lack of Transportation (Non-Medical): No  Physical Activity: Not on file  Stress: Not on file  Social Connections: Not on file  Intimate Partner Violence: Not At Risk (11/02/2023)   Humiliation, Afraid, Rape, and Kick questionnaire    Fear of Current or Ex-Partner: No    Emotionally Abused: No    Physically Abused: No    Sexually Abused: No    Family History  Problem Relation Age of Onset   Heart attack Mother    Hypertension Mother    Hypertension Father    Heart attack Father    Stroke Father      Current Outpatient Medications:    amLODipine  (NORVASC ) 5 MG tablet, Take 1 tablet (5 mg total) by mouth daily., Disp: 30 tablet, Rfl: 1   atorvastatin  (LIPITOR ) 80 MG tablet, TAKE 1 TABLET BY MOUTH AT BEDTIME, Disp: 90 tablet, Rfl: 0   Bempedoic Acid-Ezetimibe (NEXLIZET ) 180-10 MG TABS, Take 1 tablet by mouth daily., Disp: 90 tablet, Rfl: 0   clopidogrel  (PLAVIX ) 75 MG tablet, Take 1 tablet by mouth once daily, Disp: 90 tablet, Rfl: 1   metoprolol  succinate (TOPROL -XL) 25 MG 24 hr tablet, TAKE 1 TABLET BY MOUTH IN THE MORNING,  Disp: 90 tablet, Rfl: 1   benzonatate  (TESSALON ) 100 MG capsule, Take 2 capsules (200 mg total) by mouth every 8 (eight) hours. (Patient not taking: Reported on 01/17/2024), Disp: 21 capsule, Rfl: 0   ipratropium (ATROVENT ) 0.06 % nasal spray, Place 2 sprays into both nostrils 4 (four) times daily. (Patient not taking: Reported on 01/17/2024), Disp: 15 mL, Rfl: 12   losartan-hydrochlorothiazide (HYZAAR) 100-12.5 MG tablet, , Disp: , Rfl:    omeprazole  (PRILOSEC) 20 MG capsule, Take 1 capsule (20 mg total) by mouth daily as needed for up to 30 doses. (Patient not taking: Reported on 11/02/2023), Disp: 30 capsule, Rfl: 0   promethazine -dextromethorphan (PROMETHAZINE -DM) 6.25-15 MG/5ML syrup, Take 5 mLs by mouth 4 (four) times daily as needed. (Patient not taking: Reported on 11/25/2023), Disp: 118 mL, Rfl: 0   tamsulosin (FLOMAX) 0.4 MG CAPS capsule, Take 0.4 mg by mouth daily. (Patient not taking: Reported on 11/02/2023),  Disp: , Rfl:   Physical exam:  Vitals:   01/17/24 1001  BP: 118/89  Pulse: 72  Resp: 19  Temp: (!) 97.2 F (36.2 C)  TempSrc: Tympanic  SpO2: 97%  Weight: 177 lb 14.4 oz (80.7 kg)  Height: 5' 8 (1.727 m)   Physical Exam Cardiovascular:     Rate and Rhythm: Normal rate and regular rhythm.     Heart sounds: Normal heart sounds.  Pulmonary:     Effort: Pulmonary effort is normal.     Breath sounds: Normal breath sounds.  Skin:    General: Skin is warm and dry.  Neurological:     Mental Status: He is alert and oriented to person, place, and time.      I have personally reviewed labs listed below:    Latest Ref Rng & Units 09/28/2022   11:22 AM  CMP  Glucose 70 - 99 mg/dL 897   BUN 8 - 27 mg/dL 17   Creatinine 9.23 - 1.27 mg/dL 8.70   Sodium 865 - 855 mmol/L 139   Potassium 3.5 - 5.2 mmol/L 4.6   Chloride 96 - 106 mmol/L 102   CO2 20 - 29 mmol/L 24   Calcium  8.6 - 10.2 mg/dL 9.0   Total Protein 6.0 - 8.5 g/dL 7.0   Total Bilirubin 0.0 - 1.2 mg/dL 0.7   Alkaline  Phos 44 - 121 IU/L 78   AST 0 - 40 IU/L 26   ALT 0 - 44 IU/L 20       Latest Ref Rng & Units 01/17/2024   10:37 AM  CBC  WBC 4.0 - 10.5 K/uL 3.9   Hemoglobin 13.0 - 17.0 g/dL 84.5   Hematocrit 60.9 - 52.0 % 44.0   Platelets 150 - 400 K/uL 191       Assessment and plan- Patient is a 63 y.o. male here for routine follow-up of neutropenia  Patient has mild isolated neutropenia which is relatively recent but ANC remains more than 1.  Hemoglobin and platelet counts are normal.  B12 folate HIV and hepatitis C testing negative.  No B symptoms.  I am checking ANA comprehensive panel and T-cell gene rearrangement assay.  If these tests are negative I will monitor his CBC in 3 and 6 months and see him back in 6 months.  If there is a continued downward trend in his neutrophil count I will consider a bone marrow biopsy at that time   Visit Diagnosis 1. Other neutropenia      Dr. Annah Skene, MD, MPH [facility] at [facility] [phone] 01/17/2024 12:47 PM

## 2024-01-18 LAB — ANA COMPREHENSIVE PANEL

## 2024-01-28 ENCOUNTER — Ambulatory Visit: Payer: Self-pay | Admitting: Oncology

## 2024-01-28 LAB — MISC LABCORP TEST (SEND OUT): Labcorp test code: 481080

## 2024-02-22 ENCOUNTER — Telehealth: Payer: Self-pay | Admitting: Cardiology

## 2024-02-22 DIAGNOSIS — Z951 Presence of aortocoronary bypass graft: Secondary | ICD-10-CM

## 2024-02-22 DIAGNOSIS — I2510 Atherosclerotic heart disease of native coronary artery without angina pectoris: Secondary | ICD-10-CM

## 2024-02-28 MED ORDER — NEXLIZET 180-10 MG PO TABS: 1.0000 | ORAL_TABLET | Freq: Every day | ORAL | 0 refills | Status: AC

## 2024-03-01 NOTE — Telephone Encounter (Signed)
 Disp Refills Start End   Bempedoic Acid-Ezetimibe (NEXLIZET ) 180-10 MG TABS 90 tablet 0 02/28/2024 --   Sig - Route: Take 1 tablet by mouth daily. - Oral   Sent to pharmacy as: Bempedoic Acid-Ezetimibe (NEXLIZET ) 180-10 MG Tab   Notes to Pharmacy: Pt must schedule a followup appt with Cardiology in February 2026 for any more refills. [phone] Thank You   E-Prescribing Status: Receipt confirmed by pharmacy (02/28/2024  3:25 PM EST)

## 2024-03-01 NOTE — Telephone Encounter (Signed)
*  STAT* If patient is at the pharmacy, call can be transferred to refill team.   1. Which medications need to be refilled? (please list name of each medication and dose if known)   Bempedoic Acid-Ezetimibe (NEXLIZET ) 180-10 MG TABS   2. Would you like to learn more about the convenience, safety, & potential cost savings by using the [facility]?   3. Are you open to using the [facility] (Type [facility]. ).  4. Which pharmacy/location (including street and city if local pharmacy) is medication to be sent to?  [facility] - [place], [place] - [address] [facility] [address] AT Portal to Registered [facility]   5. Do they need a 30 day or 90 day supply?   90 day  Wife Merwyn) stated patient is almost out of this medication and [facility] told her they had not received the refill yet.  Patient has appointment scheduled with Dr. Michele on 3/11.

## 2024-04-05 ENCOUNTER — Other Ambulatory Visit: Payer: Self-pay | Admitting: Cardiology

## 2024-04-05 DIAGNOSIS — I2510 Atherosclerotic heart disease of native coronary artery without angina pectoris: Secondary | ICD-10-CM

## 2024-04-05 DIAGNOSIS — Z951 Presence of aortocoronary bypass graft: Secondary | ICD-10-CM

## 2024-04-18 ENCOUNTER — Inpatient Hospital Stay: Attending: Oncology

## 2024-04-18 DIAGNOSIS — D708 Other neutropenia: Secondary | ICD-10-CM | POA: Insufficient documentation

## 2024-04-18 LAB — CBC WITH DIFFERENTIAL (CANCER CENTER ONLY)
Abs Immature Granulocytes: 0.01 K/uL (ref 0.00–0.07)
Basophils Absolute: 0 K/uL (ref 0.0–0.1)
Basophils Relative: 1 %
Eosinophils Absolute: 0.2 K/uL (ref 0.0–0.5)
Eosinophils Relative: 5 %
HCT: 43.9 % (ref 39.0–52.0)
Hemoglobin: 15.2 g/dL (ref 13.0–17.0)
Immature Granulocytes: 0 %
Lymphocytes Relative: 39 %
Lymphs Abs: 1.3 K/uL (ref 0.7–4.0)
MCH: 29 pg (ref 26.0–34.0)
MCHC: 34.6 g/dL (ref 30.0–36.0)
MCV: 83.8 fL (ref 80.0–100.0)
Monocytes Absolute: 0.4 K/uL (ref 0.1–1.0)
Monocytes Relative: 12 %
Neutro Abs: 1.5 K/uL — ABNORMAL LOW (ref 1.7–7.7)
Neutrophils Relative %: 43 %
Platelet Count: 171 K/uL (ref 150–400)
RBC: 5.24 MIL/uL (ref 4.22–5.81)
RDW: 12.3 % (ref 11.5–15.5)
WBC Count: 3.4 K/uL — ABNORMAL LOW (ref 4.0–10.5)
nRBC: 0 % (ref 0.0–0.2)

## 2024-06-07 ENCOUNTER — Ambulatory Visit: Admitting: Cardiology

## 2024-07-17 ENCOUNTER — Inpatient Hospital Stay

## 2024-07-17 ENCOUNTER — Inpatient Hospital Stay: Admitting: Oncology
